# Patient Record
Sex: Male | Born: 1980
Health system: Southern US, Community
[De-identification: ages and names within clinical notes are randomized; demographics above are authoritative.]

## PROBLEM LIST (undated history)

## (undated) DIAGNOSIS — N289 Disorder of kidney and ureter, unspecified: Secondary | ICD-10-CM

## (undated) DIAGNOSIS — M5020 Other cervical disc displacement, unspecified cervical region: Secondary | ICD-10-CM

## (undated) DIAGNOSIS — E119 Type 2 diabetes mellitus without complications: Secondary | ICD-10-CM

## (undated) DIAGNOSIS — N200 Calculus of kidney: Secondary | ICD-10-CM

## (undated) HISTORY — PX: APPENDECTOMY: SHX54

## (undated) HISTORY — PX: LITHOTRIPSY: SUR834

## (undated) HISTORY — PX: KNEE ARTHROSCOPY: SUR90

## (undated) HISTORY — PX: BACK SURGERY: SHX140

## (undated) HISTORY — DX: Type 2 diabetes mellitus without complications: E11.9

---

## 2001-01-26 ENCOUNTER — Encounter: Payer: Self-pay | Admitting: Emergency Medicine

## 2001-01-26 ENCOUNTER — Emergency Department (HOSPITAL_COMMUNITY): Admission: EM | Admit: 2001-01-26 | Discharge: 2001-01-26 | Payer: Self-pay | Admitting: Emergency Medicine

## 2002-04-19 ENCOUNTER — Encounter: Payer: Self-pay | Admitting: Emergency Medicine

## 2002-04-19 ENCOUNTER — Emergency Department (HOSPITAL_COMMUNITY): Admission: EM | Admit: 2002-04-19 | Discharge: 2002-04-19 | Payer: Self-pay | Admitting: Emergency Medicine

## 2003-05-09 ENCOUNTER — Ambulatory Visit (HOSPITAL_COMMUNITY): Admission: RE | Admit: 2003-05-09 | Discharge: 2003-05-09 | Payer: Self-pay | Admitting: Internal Medicine

## 2003-10-21 ENCOUNTER — Emergency Department (HOSPITAL_COMMUNITY): Admission: EM | Admit: 2003-10-21 | Discharge: 2003-10-21 | Payer: Self-pay | Admitting: Emergency Medicine

## 2003-11-03 ENCOUNTER — Emergency Department (HOSPITAL_COMMUNITY): Admission: EM | Admit: 2003-11-03 | Discharge: 2003-11-03 | Payer: Self-pay | Admitting: Family Medicine

## 2010-10-26 ENCOUNTER — Other Ambulatory Visit (HOSPITAL_COMMUNITY): Payer: Self-pay | Admitting: Internal Medicine

## 2010-10-26 ENCOUNTER — Ambulatory Visit (HOSPITAL_COMMUNITY)
Admission: RE | Admit: 2010-10-26 | Discharge: 2010-10-26 | Disposition: A | Discharge status: Home / Self Care | Payer: BC Managed Care – PPO | Source: Ambulatory Visit | Attending: Internal Medicine | Admitting: Internal Medicine

## 2010-10-26 DIAGNOSIS — R0789 Other chest pain: Secondary | ICD-10-CM | POA: Insufficient documentation

## 2010-10-26 DIAGNOSIS — M791 Myalgia, unspecified site: Secondary | ICD-10-CM

## 2011-01-03 ENCOUNTER — Emergency Department (HOSPITAL_COMMUNITY)
Admission: EM | Admit: 2011-01-03 | Discharge: 2011-01-03 | Disposition: A | Discharge status: Home / Self Care | Payer: BC Managed Care – PPO | Attending: Emergency Medicine | Admitting: Emergency Medicine

## 2011-01-03 ENCOUNTER — Encounter: Payer: Self-pay | Admitting: *Deleted

## 2011-01-03 DIAGNOSIS — K0889 Other specified disorders of teeth and supporting structures: Secondary | ICD-10-CM

## 2011-01-03 DIAGNOSIS — H9209 Otalgia, unspecified ear: Secondary | ICD-10-CM | POA: Insufficient documentation

## 2011-01-03 DIAGNOSIS — R51 Headache: Secondary | ICD-10-CM | POA: Insufficient documentation

## 2011-01-03 DIAGNOSIS — K029 Dental caries, unspecified: Secondary | ICD-10-CM | POA: Insufficient documentation

## 2011-01-03 MED ORDER — PENICILLIN V POTASSIUM 500 MG PO TABS: 500.0000 mg | ORAL_TABLET | Freq: Four times a day (QID) | ORAL | Status: AC

## 2011-01-03 MED ORDER — OXYCODONE-ACETAMINOPHEN 5-325 MG PO TABS: 1.0000 | ORAL_TABLET | ORAL | Status: AC | PRN

## 2011-01-03 MED ORDER — PENICILLIN V POTASSIUM 250 MG PO TABS
500.0000 mg | ORAL_TABLET | Freq: Once | ORAL | Status: AC
  Administered 2011-01-03: 500 mg via ORAL
  Filled 2011-01-03: qty 2

## 2011-01-03 MED ORDER — OXYCODONE-ACETAMINOPHEN 5-325 MG PO TABS
2.0000 | ORAL_TABLET | Freq: Once | ORAL | Status: AC
  Administered 2011-01-03: 2 via ORAL
  Filled 2011-01-03: qty 2

## 2011-01-03 NOTE — ED Notes (Signed)
C/o wisdom teeth pain (upper and lower) x 2 days; states took goody powder today and hydrocodone yesterday with no relief

## 2011-01-03 NOTE — ED Notes (Signed)
Iva Lento, PA-C at bedside to examine

## 2011-01-03 NOTE — ED Provider Notes (Signed)
History     Chief Complaint  Patient presents with  . Dental Pain    c/o right sided molar pain (upper and lower) x 2 days   HPI Comments: Patient c/o dental pain for two days.  States he has appt with a dentist on Monday.  Has hx of previous dental infections.    Also c/o right sided headache that gradually began today.  He denies fever, difficulty swallowing ,breathing, neck pain or siffness or vomiting.  States the teeth are so painful that he has not been able to sleep.    Patient is a 30 y.o. male presenting with tooth pain. The history is provided by the patient.  Dental PainThe primary symptoms include mouth pain and headaches. Primary symptoms do not include oral bleeding, fever, shortness of breath, sore throat, angioedema or cough. The symptoms began 2 days ago. The symptoms are worsening. The symptoms are chronic. The symptoms occur constantly.  Mouth pain began 24 -48 hours ago. Mouth pain occurs constantly. Mouth pain is worsening. Affected locations include: teeth. At its highest the mouth pain was at 10/10. The mouth pain is currently at 10/10.  The headache is not associated with eye pain, neck stiffness or weakness.  Additional symptoms include: dental sensitivity to temperature, gum tenderness and ear pain. Additional symptoms do not include: gum swelling, purulent gums, trismus, facial swelling, trouble swallowing, drooling and swollen glands.    History reviewed. No pertinent past medical history.  Past Surgical History  Procedure Date  . Appendectomy   . Knee arthroscopy     No family history on file.  History  Substance Use Topics  . Smoking status: Current Everyday Smoker -- 1.0 packs/day    Types: Cigarettes  . Smokeless tobacco: Not on file  . Alcohol Use: No      Review of Systems  Constitutional: Negative for fever, activity change and appetite change.  HENT: Positive for ear pain and dental problem. Negative for sore throat, facial swelling,  drooling, trouble swallowing, neck pain and neck stiffness.   Eyes: Negative for pain.  Respiratory: Negative for cough and shortness of breath.   Gastrointestinal: Negative for nausea, vomiting and abdominal pain.  Musculoskeletal: Negative.   Skin: Negative.   Neurological: Positive for headaches. Negative for dizziness, facial asymmetry, weakness and numbness.  Hematological: Does not bruise/bleed easily.    Physical Exam  BP 143/87  Pulse 71  Temp(Src) 98.9 F (37.2 C) (Oral)  Resp 20  Ht 6\' 3"  (1.905 m)  Wt 245 lb (111.131 kg)  BMI 30.62 kg/m2  SpO2 100%  Physical Exam  Nursing note and vitals reviewed. Constitutional: He is oriented to person, place, and time. Vital signs are normal. He appears well-developed and well-nourished.  Non-toxic appearance.  HENT:  Head: Normocephalic and atraumatic. No trismus in the jaw.  Right Ear: External ear normal.  Left Ear: External ear normal.  Mouth/Throat: Uvula is midline, oropharynx is clear and moist and mucous membranes are normal. Dental caries present. No dental abscesses or uvula swelling.    Eyes: EOM are normal. Pupils are equal, round, and reactive to light.  Neck: Normal range of motion. Neck supple. No thyromegaly present.  Cardiovascular: Normal rate, regular rhythm and normal heart sounds.   Pulmonary/Chest: Effort normal and breath sounds normal.  Abdominal: There is no tenderness.  Musculoskeletal: Normal range of motion.  Lymphadenopathy:    He has no cervical adenopathy.  Neurological: He is alert and oriented to person, place, and time. He  exhibits normal muscle tone. Coordination normal.  Skin: Skin is warm and dry.  Psychiatric: He has a normal mood and affect.    ED Course  Procedures  MDM     Multiple dental caries.  Decay of the right lower third molar and right upper third molar.  Vitals stable.  Non-toxic appearing,  Airway intact.      Launa Goedken L. [place], [place] 01/03/11 2131

## 2011-01-04 NOTE — ED Provider Notes (Signed)
Medical screening examination/treatment/procedure(s) were performed by non-physician practitioner and as supervising physician I was immediately available for consultation/collaboration.   Laray Anger, DO 01/04/11 1658

## 2011-05-11 ENCOUNTER — Emergency Department (HOSPITAL_COMMUNITY)
Admission: EM | Admit: 2011-05-11 | Discharge: 2011-05-11 | Disposition: A | Discharge status: Home / Self Care | Payer: BC Managed Care – PPO | Attending: Emergency Medicine | Admitting: Emergency Medicine

## 2011-05-11 ENCOUNTER — Encounter (HOSPITAL_COMMUNITY): Payer: Self-pay

## 2011-05-11 ENCOUNTER — Emergency Department (HOSPITAL_COMMUNITY): Payer: BC Managed Care – PPO

## 2011-05-11 DIAGNOSIS — H6690 Otitis media, unspecified, unspecified ear: Secondary | ICD-10-CM | POA: Insufficient documentation

## 2011-05-11 DIAGNOSIS — R059 Cough, unspecified: Secondary | ICD-10-CM | POA: Insufficient documentation

## 2011-05-11 DIAGNOSIS — F17200 Nicotine dependence, unspecified, uncomplicated: Secondary | ICD-10-CM | POA: Insufficient documentation

## 2011-05-11 DIAGNOSIS — R05 Cough: Secondary | ICD-10-CM | POA: Insufficient documentation

## 2011-05-11 MED ORDER — AMOXICILLIN 500 MG PO CAPS: 500.0000 mg | ORAL_CAPSULE | Freq: Three times a day (TID) | ORAL | Status: AC

## 2011-05-11 MED ORDER — IBUPROFEN 800 MG PO TABS
ORAL_TABLET | ORAL | Status: AC
  Filled 2011-05-11: qty 1

## 2011-05-11 MED ORDER — GUAIFENESIN-CODEINE 100-10 MG/5ML PO SYRP: 10.0000 mL | ORAL_SOLUTION | Freq: Three times a day (TID) | ORAL | Status: AC | PRN

## 2011-05-11 MED ORDER — IBUPROFEN 800 MG PO TABS
800.0000 mg | ORAL_TABLET | Freq: Once | ORAL | Status: AC
  Administered 2011-05-11: 800 mg via ORAL

## 2011-05-11 NOTE — ED Notes (Signed)
Pt presents with flu like symptoms. Pt reports cough, fever, body aches, nasal congestion, and vomiting. Symptoms started on Thursday. Pts was recently exposed to strep and flu. Pts daughters were recently diagnosed with flu and strep.

## 2011-05-11 NOTE — ED Notes (Signed)
Tammy PA notified of vital signs, additional orders given and carried out

## 2011-05-11 NOTE — ED Notes (Signed)
Tammy PA advised to discharge pt

## 2011-05-11 NOTE — ED Provider Notes (Signed)
History     CSN: [id number] Arrival date & time: 05/11/2011 11:00 AM   First MD Initiated Contact with Patient 05/11/11 1126      Chief Complaint  Patient presents with  . Cough  . Nasal Congestion  . Fever  . Emesis  . Generalized Body Aches    (Consider location/radiation/quality/duration/timing/severity/associated sxs/prior treatment) Patient is a 30 y.o. male presenting with URI. The history is provided by the patient.  URI The primary symptoms include fever, headaches, ear pain, sore throat, swollen glands, cough and myalgias. Primary symptoms do not include fatigue, wheezing, abdominal pain, nausea, vomiting, arthralgias or rash. The current episode started 2 days ago. This is a new problem. The problem has not changed since onset. The fever began yesterday. The fever has been unchanged since its onset. The maximum temperature recorded prior to his arrival was 102 to 102.9 F. The temperature was taken by an oral thermometer.  The headache is not associated with eye pain, neck stiffness or weakness.  The ear pain began yesterday. Ear pain is a new problem. The ear pain has been unchanged since its onset. The left ear is affected. The pain is mild.    The sore throat began 2 days ago. The sore throat has been unchanged since its onset. The sore throat is mild in intensity. The sore throat is not accompanied by trouble swallowing, drooling, hoarse voice or stridor.  The swelling is not associated with trouble swallowing or neck pain.  The cough began 2 days ago. The cough is new. The cough is non-productive. There is nondescript sputum produced.  The myalgias are not associated with weakness.  The onset of the illness is associated with exposure to sick contacts. Symptoms associated with the illness include chills, plugged ear sensation, congestion and rhinorrhea. The illness is not associated with facial pain or sinus pressure.    History reviewed. No pertinent past medical  history.  Past Surgical History  Procedure Date  . Appendectomy   . Knee arthroscopy     History reviewed. No pertinent family history.  History  Substance Use Topics  . Smoking status: Current Everyday Smoker -- 1.0 packs/day    Types: Cigarettes  . Smokeless tobacco: Not on file  . Alcohol Use: No      Review of Systems  Constitutional: Positive for fever and chills. Negative for appetite change and fatigue.  HENT: Positive for ear pain, congestion, sore throat and rhinorrhea. Negative for hoarse voice, facial swelling, drooling, trouble swallowing, neck pain, neck stiffness and sinus pressure.   Eyes: Negative for pain and visual disturbance.  Respiratory: Positive for cough. Negative for chest tightness, shortness of breath, wheezing and stridor.   Cardiovascular: Negative for chest pain and palpitations.  Gastrointestinal: Negative for nausea, vomiting and abdominal pain.  Genitourinary: Negative for dysuria, hematuria and flank pain.  Musculoskeletal: Positive for myalgias. Negative for back pain and arthralgias.  Skin: Negative.  Negative for rash.  Neurological: Positive for headaches. Negative for dizziness, weakness and numbness.  Hematological: Does not bruise/bleed easily.    Allergies  Review of patient's allergies indicates no known allergies.  Home Medications   Current Outpatient Rx  Name Route Sig Dispense Refill  . GOODY HEADACHE PO Oral Take 1 Container by mouth every 4 (four) hours as needed. For tooth pain    . NYQUIL PO Oral Take 1 capsule by mouth daily.      . DAYQUIL PO Oral Take 1 capsule by mouth daily.  BP 148/85  Pulse 99  Temp(Src) 98.9 F (37.2 C) (Oral)  Resp 20  Ht 6\' 4"  (1.93 m)  Wt 235 lb (106.595 kg)  BMI 28.61 kg/m2  SpO2 98%  Physical Exam  Nursing note and vitals reviewed. Constitutional: He is oriented to person, place, and time. He appears well-developed and well-nourished. No distress.  HENT:  Head:  Normocephalic and atraumatic. No trismus in the jaw.  Right Ear: Tympanic membrane and ear canal normal.  Left Ear: Ear canal normal. No mastoid tenderness. Tympanic membrane is erythematous and bulging. No hemotympanum.  Nose: Mucosal edema and rhinorrhea present.  Mouth/Throat: Uvula is midline, oropharynx is clear and moist and mucous membranes are normal. No uvula swelling.  Eyes: EOM are normal. Pupils are equal, round, and reactive to light.  Neck: Normal range of motion. Neck supple.  Cardiovascular: Normal rate, regular rhythm and normal heart sounds.   Pulmonary/Chest: Effort normal and breath sounds normal. No respiratory distress. He has no wheezes. He has no rales. He exhibits no tenderness.  Abdominal: Soft. He exhibits no distension. There is no tenderness.  Musculoskeletal: Normal range of motion. He exhibits no edema and no tenderness.  Lymphadenopathy:    He has no cervical adenopathy.  Neurological: He is alert and oriented to person, place, and time. No cranial nerve deficit. He exhibits normal muscle tone. Coordination normal.  Skin: Skin is warm and dry.    ED Course  Procedures (including critical care time)   Dg Chest 2 View  05/11/2011  *RADIOLOGY REPORT*  Clinical Data: Cough, fever  CHEST - 2 VIEW  Comparison:  10/26/2010  Findings:  The heart size and mediastinal contours are within normal limits.  Both lungs are clear.  The visualized skeletal structures are unremarkable.  IMPRESSION: No active cardiopulmonary disease.  Original Report Authenticated By: Judie Petit. Ruel Favors, M.D.         MDM   2:06 PM   Patient is ambulatory.  Non-toxic appearing.  Vitals stable. Lung sounds are coarse throughout.  Has left otitis media that I will treat with amoxil .  F/u if needed  Patient / Family / Caregiver understand and agree with initial ED impression and plan with expectations set for ED visit.         Hanadi Stanly L. [place], [place] 05/12/11 1947

## 2011-05-11 NOTE — ED Notes (Signed)
C/o generalized body aches, fever, cough, chills, pt recently exposed to daughter who has been dx with strep and flu, started a few days ago,

## 2011-05-13 NOTE — ED Provider Notes (Signed)
Medical screening examination/treatment/procedure(s) were performed by non-physician practitioner and as supervising physician I was immediately available for consultation/collaboration. Devoria Albe, MD, Arm[facility] Gang   Ward Givens, MD 05/13/11 [id number]

## 2012-04-02 ENCOUNTER — Emergency Department (HOSPITAL_COMMUNITY)
Admission: EM | Admit: 2012-04-02 | Discharge: 2012-04-02 | Disposition: A | Discharge status: Home / Self Care | Payer: BC Managed Care – PPO | Attending: Emergency Medicine | Admitting: Emergency Medicine

## 2012-04-02 ENCOUNTER — Encounter (HOSPITAL_COMMUNITY): Payer: Self-pay | Admitting: *Deleted

## 2012-04-02 ENCOUNTER — Emergency Department (HOSPITAL_COMMUNITY): Payer: BC Managed Care – PPO

## 2012-04-02 DIAGNOSIS — J3489 Other specified disorders of nose and nasal sinuses: Secondary | ICD-10-CM

## 2012-04-02 DIAGNOSIS — R197 Diarrhea, unspecified: Secondary | ICD-10-CM | POA: Insufficient documentation

## 2012-04-02 DIAGNOSIS — F17200 Nicotine dependence, unspecified, uncomplicated: Secondary | ICD-10-CM | POA: Insufficient documentation

## 2012-04-02 DIAGNOSIS — Z9889 Other specified postprocedural states: Secondary | ICD-10-CM | POA: Insufficient documentation

## 2012-04-02 DIAGNOSIS — J189 Pneumonia, unspecified organism: Secondary | ICD-10-CM | POA: Insufficient documentation

## 2012-04-02 DIAGNOSIS — E860 Dehydration: Secondary | ICD-10-CM

## 2012-04-02 DIAGNOSIS — R112 Nausea with vomiting, unspecified: Secondary | ICD-10-CM | POA: Insufficient documentation

## 2012-04-02 MED ORDER — SODIUM CHLORIDE 0.9 % IV SOLN
1000.0000 mL | Freq: Once | INTRAVENOUS | Status: AC
  Administered 2012-04-02: 1000 mL via INTRAVENOUS

## 2012-04-02 MED ORDER — SODIUM CHLORIDE 0.9 % IV SOLN
1000.0000 mL | INTRAVENOUS | Status: DC
  Administered 2012-04-02: 1000 mL via INTRAVENOUS

## 2012-04-02 MED ORDER — DEXTROSE 5 % IV SOLN
1.0000 g | Freq: Once | INTRAVENOUS | Status: AC
  Administered 2012-04-02: 1 g via INTRAVENOUS
  Filled 2012-04-02: qty 10

## 2012-04-02 MED ORDER — ONDANSETRON 8 MG PO TBDP
8.0000 mg | ORAL_TABLET | Freq: Once | ORAL | Status: AC
  Administered 2012-04-02: 8 mg via ORAL
  Filled 2012-04-02: qty 1

## 2012-04-02 MED ORDER — ONDANSETRON 8 MG PO TBDP: 8.0000 mg | ORAL_TABLET | Freq: Three times a day (TID) | ORAL | Status: DC | PRN

## 2012-04-02 MED ORDER — AZITHROMYCIN 250 MG PO TABS
500.0000 mg | ORAL_TABLET | Freq: Once | ORAL | Status: AC
  Administered 2012-04-02: 500 mg via ORAL
  Filled 2012-04-02: qty 2

## 2012-04-02 MED ORDER — AZITHROMYCIN 250 MG PO TABS: ORAL_TABLET | ORAL | Status: DC

## 2012-04-02 MED ORDER — KETOROLAC TROMETHAMINE 30 MG/ML IJ SOLN
30.0000 mg | Freq: Once | INTRAMUSCULAR | Status: AC
  Administered 2012-04-02: 30 mg via INTRAVENOUS
  Filled 2012-04-02: qty 1

## 2012-04-02 MED ORDER — MUCINEX DM 30-600 MG PO TB12: ORAL_TABLET | ORAL | Status: DC

## 2012-04-02 NOTE — ED Notes (Signed)
Rainbow and 1 set of blood cultures drawn prior to administration of IV antibiotic. EDP made aware, no blood work orders given at this time.

## 2012-04-02 NOTE — ED Notes (Signed)
Pt alert & oriented x4, stable gait. Patient given discharge instructions, paperwork & prescription(s). Patient  instructed to stop at the registration desk to finish any additional paperwork. Patient verbalized understanding. Pt left department w/ no further questions. 

## 2012-04-02 NOTE — ED Notes (Signed)
Patient c/o generalized body aches. Rates pain at a 7-8 out of 10. EDP made aware, verbal order given.

## 2012-04-02 NOTE — ED Provider Notes (Cosign Needed)
History   This chart was scribed for Ward Givens, MD by Gerlean Ren. This patient was seen in room [facility]03/[facility]03 and the patient's care was started at 15:45.   CSN: [id number]  Arrival date & time 04/02/12  1503   First MD Initiated Contact with Patient 04/02/12 1524      Chief Complaint  Patient presents with  . Influenza    (Consider location/radiation/quality/duration/timing/severity/associated sxs/prior treatment) The history is provided by the patient. No language interpreter was used.  Shane Mayo is a 31 y.o. male who presents to the Emergency Department complaining of constant flu-like myalgias with associated white phlegm-producing cough, "plugged up ears, yellow rhinorrhea, and HA that began last week and resolved over the weekend with Nyquil and Dayquil but returned 3 days ago and are gradually worsening.  Pt reports 5 episodes of non-bloody diarrhea and non-bloody, non-bilious emesis 2 days ago that has since resolved but with persistent nausea, and 101.2 fever 2 days ago that has since resolved but reports waxing-and-waning chills since.  Pt denies wheezes, dyspnea, sore throat, and CP. States he feels weak.  He states he doesn't have chest pain but his chest is sore when he coughs.  Pt reports known sick contacts at work.  Pt is a current everyday smoker but denies alcohol use.     Dr. Ouida Sills is PCP.    History reviewed. No pertinent past medical history.  Past Surgical History  Procedure Date  . Appendectomy   . Knee arthroscopy     No family history on file.  History  Substance Use Topics  . Smoking status: Current Every Day Smoker -- 1.0 packs/day    Types: Cigarettes  . Smokeless tobacco: Not on file  . Alcohol Use: No   employed   Review of Systems  Constitutional: Positive for chills. Negative for fever.  HENT: Positive for rhinorrhea. Negative for ear pain and sore throat.   Respiratory: Positive for cough. Negative for chest tightness, shortness of  breath and wheezing.   Cardiovascular: Negative for chest pain.  Gastrointestinal: Positive for nausea. Negative for vomiting and diarrhea.  Musculoskeletal: Positive for myalgias.  Neurological: Positive for weakness. Negative for dizziness.  All other systems reviewed and are negative.    Allergies  Review of patient's allergies indicates no known allergies.  Home Medications   Current Outpatient Rx  Name Route Sig Dispense Refill  . GOODY HEADACHE PO Oral Take 1 Container by mouth every 4 (four) hours as needed. For tooth pain    . NYQUIL PO Oral Take 1 capsule by mouth daily.      . DAYQUIL PO Oral Take 1 capsule by mouth daily.        BP 153/89  Pulse 120  Temp 99.1 F (37.3 C)  Resp 20  Ht 6\' 4"  (1.93 m)  Wt 240 lb (108.863 kg)  BMI 29.21 kg/m2  SpO2 100%  Vital signs normal except tachycardia   Physical Exam  Nursing note and vitals reviewed. Constitutional: He is oriented to person, place, and time. He appears well-developed and well-nourished.  Non-toxic appearance. He does not appear ill. No distress.  HENT:  Head: Normocephalic and atraumatic.  Right Ear: External ear normal.  Left Ear: External ear normal.  Nose: Nose normal. No mucosal edema or rhinorrhea.  Mouth/Throat: Oropharynx is clear and moist and mucous membranes are normal. No dental abscesses or uvula swelling. No posterior oropharyngeal erythema.  Eyes: Conjunctivae normal and EOM are normal. Pupils are equal, round,  and reactive to light.  Neck: Normal range of motion and full passive range of motion without pain. Neck supple.  Cardiovascular: Regular rhythm and normal heart sounds.  Exam reveals no gallop and no friction rub.   No murmur heard.      Tachycardia.  Pulmonary/Chest: Effort normal and breath sounds normal. No respiratory distress. He has no wheezes. He has no rhonchi. He has no rales. He exhibits no tenderness and no crepitus.  Abdominal: Soft. Normal appearance and bowel sounds  are normal. He exhibits no distension. There is no tenderness. There is no rebound and no guarding.  Musculoskeletal: Normal range of motion. He exhibits no edema and no tenderness.       Moves all extremities well.   Neurological: He is alert and oriented to person, place, and time. He has normal strength. No cranial nerve deficit.  Skin: Skin is warm, dry and intact. No rash noted. No erythema. No pallor.  Psychiatric: He has a normal mood and affect. His speech is normal and behavior is normal. His mood appears not anxious.    ED Course  Procedures (including critical care time)   Medications  0.9 %  sodium chloride infusion (0 mL Intravenous Stopped 04/02/12 1842)    Followed by  0.9 %  sodium chloride infusion (1000 mL Intravenous New Bag/Given 04/02/12 1842)    Followed by  0.9 %  sodium chloride infusion (not administered)  azithromycin (ZITHROMAX) tablet 500 mg (not administered)  ondansetron (ZOFRAN-ODT) disintegrating tablet 8 mg (8 mg Oral Given 04/02/12 1616)  cefTRIAXone (ROCEPHIN) 1 g in dextrose 5 % 50 mL IVPB (0 g Intravenous Stopped 04/02/12 1805)  ketorolac (TORADOL) 30 MG/ML injection 30 mg (30 mg Intravenous Given 04/02/12 1854)    DIAGNOSTIC STUDIES: Oxygen Saturation is 100% on room air, normal by my interpretation.    COORDINATION OF CARE: 15:52- Patient informed of clinical course, understands medical decision-making process, and agrees with plan.  Ordered PO zofran (discussed IV zofran if emesis occurs) and chest XR. 16:47- Re-check, informed pt of positive XR for pneumonia.   18:53- Re-check, discussed discharge with prescribed antibiotics.  Labs Reviewed - No data to display Dg Chest 2 View  04/02/2012  *RADIOLOGY REPORT*  Clinical Data: 31 year old male cough weakness fever shortness of breath.  CHEST - 2 VIEW  Comparison: 05/11/2011.  Findings: Consolidation in the peripheral right upper lobe along the major fissure.  Stable cardiac size and mediastinal  contours. Stable lung volumes.  Lung parenchyma elsewhere remains clear.  No effusion. Visualized tracheal air column is within normal limits. No acute osseous abnormality identified.  IMPRESSION: Right upper lobe pneumonia. Post-treatment radiographs recommended to document resolution.   Original Report Authenticated By: Harley Hallmark, M.D.      1. Community acquired pneumonia   2. Dehydration   3. Vomiting and diarrhea   4. Rhinorrhea     New Prescriptions   AZITHROMYCIN (ZITHROMAX Z-PAK) 250 MG TABLET    Take 1 pill once a day for the next 4 days starting Friday afternoon   DEXTROMETHORPHAN-GUAIFENESIN (MUCINEX DM) 30-600 MG TB12    Take 1 or 2 po Q 12hrs for cough, congestion   ONDANSETRON (ZOFRAN ODT) 8 MG DISINTEGRATING TABLET    Take 1 tablet (8 mg total) by mouth every 8 (eight) hours as needed for nausea.    Plan discharge  Devoria Albe, MD, FACEP   MDM   I personally performed the services described in this documentation, which was scribed in  my presence. The recorded information has been reviewed and considered.  Devoria Albe, MD, FACEP      Ward Givens, MD 04/02/12 Ernestina [place]

## 2012-04-02 NOTE — ED Notes (Signed)
C/o fever, chills, body aches, headache, bilateral ear pain, runny nose,  Cough that is productive with yellow phlegm. N/v/d, Pt states that the started getting sick two weeks ago,

## 2012-11-16 ENCOUNTER — Other Ambulatory Visit (HOSPITAL_COMMUNITY): Payer: Self-pay | Admitting: Internal Medicine

## 2012-11-16 DIAGNOSIS — M545 Low back pain, unspecified: Secondary | ICD-10-CM

## 2012-11-18 ENCOUNTER — Ambulatory Visit (HOSPITAL_COMMUNITY)
Admission: RE | Admit: 2012-11-18 | Discharge: 2012-11-18 | Disposition: A | Discharge status: Home / Self Care | Payer: BC Managed Care – PPO | Source: Ambulatory Visit | Attending: Internal Medicine | Admitting: Internal Medicine

## 2012-11-18 DIAGNOSIS — M545 Low back pain, unspecified: Secondary | ICD-10-CM | POA: Insufficient documentation

## 2012-11-18 DIAGNOSIS — M5126 Other intervertebral disc displacement, lumbar region: Secondary | ICD-10-CM | POA: Insufficient documentation

## 2013-01-12 ENCOUNTER — Emergency Department (HOSPITAL_COMMUNITY)
Admission: EM | Admit: 2013-01-12 | Discharge: 2013-01-12 | Disposition: A | Discharge status: Home / Self Care | Payer: BC Managed Care – PPO | Attending: Emergency Medicine | Admitting: Emergency Medicine

## 2013-01-12 ENCOUNTER — Encounter (HOSPITAL_COMMUNITY): Payer: Self-pay

## 2013-01-12 ENCOUNTER — Emergency Department (HOSPITAL_COMMUNITY): Payer: BC Managed Care – PPO

## 2013-01-12 DIAGNOSIS — Z8739 Personal history of other diseases of the musculoskeletal system and connective tissue: Secondary | ICD-10-CM | POA: Insufficient documentation

## 2013-01-12 DIAGNOSIS — Z9889 Other specified postprocedural states: Secondary | ICD-10-CM | POA: Insufficient documentation

## 2013-01-12 DIAGNOSIS — R109 Unspecified abdominal pain: Secondary | ICD-10-CM

## 2013-01-12 DIAGNOSIS — R1032 Left lower quadrant pain: Secondary | ICD-10-CM | POA: Insufficient documentation

## 2013-01-12 DIAGNOSIS — F17200 Nicotine dependence, unspecified, uncomplicated: Secondary | ICD-10-CM | POA: Insufficient documentation

## 2013-01-12 DIAGNOSIS — G8929 Other chronic pain: Secondary | ICD-10-CM | POA: Insufficient documentation

## 2013-01-12 DIAGNOSIS — R110 Nausea: Secondary | ICD-10-CM | POA: Insufficient documentation

## 2013-01-12 DIAGNOSIS — M549 Dorsalgia, unspecified: Secondary | ICD-10-CM | POA: Insufficient documentation

## 2013-01-12 HISTORY — DX: Other cervical disc displacement, unspecified cervical region: M50.20

## 2013-01-12 LAB — URINALYSIS, ROUTINE W REFLEX MICROSCOPIC
Hgb urine dipstick: NEGATIVE
Ketones, ur: NEGATIVE mg/dL
Protein, ur: NEGATIVE mg/dL
Urobilinogen, UA: 0.2 mg/dL (ref 0.0–1.0)

## 2013-01-12 MED ORDER — DICYCLOMINE HCL 20 MG PO TABS: 20.0000 mg | ORAL_TABLET | Freq: Four times a day (QID) | ORAL | Status: DC | PRN

## 2013-01-12 MED ORDER — TRAMADOL HCL 50 MG PO TABS: 50.0000 mg | ORAL_TABLET | Freq: Four times a day (QID) | ORAL | Status: DC | PRN

## 2013-01-12 NOTE — ED Notes (Signed)
States that he saw his PCP about 11 days ago and was placed on antibiotics for suspected epididymitis.  States that he continued to have pain and called his PCP on Friday and was placed on a stronger antibiotic.  States that he woke up today with excruciating pain in his right groin and abdomen and was referred to the ER for evaluation.

## 2013-01-12 NOTE — ED Notes (Signed)
Pt reports left side ab pain and groin pain for several weeks, has been seen by his pmd, and unable to find the cause, denies any blood in urine or painful urination, +nausea.  No discharge. Or fever.

## 2013-01-12 NOTE — ED Provider Notes (Signed)
CSN: [id number]     Arrival date & time 01/12/13  1134 History  This chart was scribed for Gilda Crease, MD by Bennett Scrape, ED Scribe. This patient was seen in room APA04/APA04 and the patient's care was started at 11:46 AM.    Chief Complaint  Patient presents with  . Abdominal Pain    The history is provided by the patient. No language interpreter was used.    HPI Comments: Shane Mayo is a 32 y.o. male who presents to the Emergency Department complaining of LLQ abdominal pain that started this morning. Pt states that he has a h/o chronic back pain and began to ave radiation into his right groin 2 weeks ago. He states that the back pain began improving but the right groin pain remained. He was evaluated by his PCP 11 days ago and started on antibiotics for suspected epididymitis. He reports that the pain has continued, so he called his PCP back 3 days ago and was started on a stronger antibiotic. He states that today he woke today with "severe" LLQ abdominal pain that radiates from the right groin. He reports associated nausea but denies urinary symptoms, emesis and diarrhea.  Past Medical History  Diagnosis Date  . Herniated cervical disc    Past Surgical History  Procedure Laterality Date  . Appendectomy    . Knee arthroscopy     No family history on file. History  Substance Use Topics  . Smoking status: Current Every Day Smoker -- 1.00 packs/day    Types: Cigarettes  . Smokeless tobacco: Not on file  . Alcohol Use: No    Review of Systems  Gastrointestinal: Positive for nausea and abdominal pain. Negative for vomiting and diarrhea.  Genitourinary: Negative for dysuria and hematuria.  Musculoskeletal: Positive for back pain.  All other systems reviewed and are negative.    Allergies  Review of patient's allergies indicates no known allergies.  Home Medications   Current Outpatient Rx  Name  Route  Sig  Dispense  Refill  . azithromycin (ZITHROMAX  Z-PAK) 250 MG tablet      Take 1 pill once a day for the next 4 days starting Friday afternoon   4 tablet   0   . Dextromethorphan-Guaifenesin (MUCINEX DM) 30-600 MG TB12      Take 1 or 2 po Q 12hrs for cough, congestion   28 each   0   . ondansetron (ZOFRAN ODT) 8 MG disintegrating tablet   Oral   Take 1 tablet (8 mg total) by mouth every 8 (eight) hours as needed for nausea.   6 tablet   0   . Pseudoeph-Doxylamine-DM-APAP (NYQUIL PO)   Oral   Take 30 mLs by mouth at bedtime as needed and may repeat dose one time if needed. For cough and flu symptoms         . Pseudoephedrine-APAP-DM (DAYQUIL PO)   Oral   Take 30 mLs by mouth every 6 (six) hours as needed. For cough and flu symptoms          Triage Vitals: BP 141/83  Pulse 88  Temp(Src) 97.8 F (36.6 C)  Resp 20  Ht 6\' 4"  (1.93 m)  Wt 245 lb (111.131 kg)  BMI 29.83 kg/m2  SpO2 100%  Physical Exam  Nursing note and vitals reviewed. Constitutional: He is oriented to person, place, and time. He appears well-developed and well-nourished. No distress.  HENT:  Head: Normocephalic and atraumatic.  Right Ear: Hearing normal.  Left Ear: Hearing normal.  Nose: Nose normal.  Mouth/Throat: Oropharynx is clear and moist and mucous membranes are normal.  Eyes: Conjunctivae and EOM are normal. Pupils are equal, round, and reactive to light.  Neck: Normal range of motion. Neck supple.  Cardiovascular: Regular rhythm, S1 normal and S2 normal.  Exam reveals no gallop and no friction rub.   No murmur heard. Pulmonary/Chest: Effort normal and breath sounds normal. No respiratory distress. He exhibits no tenderness.  Abdominal: Soft. Normal appearance and bowel sounds are normal. There is no hepatosplenomegaly. There is tenderness (LLQ). There is no rebound, no guarding, no tenderness at McBurney's point and negative Murphy's sign. No hernia.  Musculoskeletal: Normal range of motion.  Neurological: He is alert and oriented to  person, place, and time. He has normal strength. No cranial nerve deficit or sensory deficit. Coordination normal. GCS eye subscore is 4. GCS verbal subscore is 5. GCS motor subscore is 6.  Skin: Skin is warm, dry and intact. No rash noted. No cyanosis.  Psychiatric: He has a normal mood and affect. His speech is normal and behavior is normal. Thought content normal.    ED Course   Procedures (including critical care time)  DIAGNOSTIC STUDIES: Oxygen Saturation is 100% on room air, normal by my interpretation.    COORDINATION OF CARE: 11:49 AM-Discussed treatment plan which includes CT of abdomen and UA with pt at bedside and pt agreed to plan.   Labs Reviewed  URINALYSIS, ROUTINE W REFLEX MICROSCOPIC - Abnormal; Notable for the following:    Specific Gravity, Urine >1.030 (*)    All other components within normal limits   Ct Abdomen Pelvis Wo Contrast  01/12/2013   *RADIOLOGY REPORT*  Clinical Data: Left flank pain.  Right groin and testicular pain. Being treated for suspected epididymitis.  Prior cholecystectomy.  CT ABDOMEN AND PELVIS WITHOUT CONTRAST  Technique:  Multidetector CT imaging of the abdomen and pelvis was performed following the standard protocol without intravenous contrast.  Comparison: 11/18/2012 MR lumbar spine.  Findings: No extraluminal bowel inflammatory process, free fluid or free air. Post appendectomy.  No renal or ureteral obstructing stone or findings to suggest hydronephrosis.  Fatty infiltration of the liver. Evaluation of solid abdominal viscera is limited by lack of IV contrast.  Taking this limitation into account no worrisome hepatic, splenic, pancreatic, renal or adrenal lesion.  No calcified gallstones.  No abdominal aortic aneurysm.  Calcification right common iliac artery and right internal iliac artery suggestive of age advanced atherosclerosis.  Degenerative changes lumbar spine most notable L5-S1.  Please see prior lumbar spine MR report.  Decompressed  urinary bladder without gross abnormality.  No adenopathy.  Minimal atelectatic changes/scarring lung bases.  IMPRESSION: No extraluminal bowel inflammatory process, free fluid or free air. Post appendectomy.  No renal or ureteral obstructing stone or findings to suggest hydronephrosis.  Fatty infiltration of the liver.  Calcification right common iliac artery and right internal iliac artery suggestive of age advanced atherosclerosis.  Degenerative changes lumbar spine most notable L5-S1.  Please see prior lumbar spine MR report.   Original Report Authenticated By: Lacy Duverney, M.D.   Diagnosis: Abdominal pain  MDM  Patient presents to the ER for evaluation of abdominal pain. Patient has not had any urinary symptoms. He saw his doctor a couple of weeks ago and was placed on Cipro for possible urinary tract infection. He reports that today he woke up with severe, sharp and stabbing pains in the left side of his abdomen  which were new. He was told to go the ER by his doctor to be evaluated for kidney stone.  Urinalysis was unremarkable. CT scan was performed and does not show any evidence of ureterolithiasis. Additionally there is no sign of diverticulitis or other pathology.  Patient was reassured, given analgesia and followup with primary doctor.  I personally performed the services described in this documentation, which was scribed in my presence. The recorded information has been reviewed and is accurate.   Gilda Crease, MD 01/12/13 [phone]

## 2013-02-09 ENCOUNTER — Ambulatory Visit: Payer: BC Managed Care – PPO | Admitting: Urology

## 2013-03-09 ENCOUNTER — Ambulatory Visit (INDEPENDENT_AMBULATORY_CARE_PROVIDER_SITE_OTHER): Payer: BC Managed Care – PPO | Admitting: Urology

## 2013-03-09 DIAGNOSIS — N509 Disorder of male genital organs, unspecified: Secondary | ICD-10-CM

## 2013-03-09 DIAGNOSIS — R319 Hematuria, unspecified: Secondary | ICD-10-CM

## 2013-04-27 ENCOUNTER — Ambulatory Visit: Payer: BC Managed Care – PPO | Admitting: Urology

## 2013-12-19 ENCOUNTER — Emergency Department (HOSPITAL_COMMUNITY)
Admission: EM | Admit: 2013-12-19 | Discharge: 2013-12-19 | Disposition: A | Discharge status: Home / Self Care | Payer: BC Managed Care – PPO | Attending: Emergency Medicine | Admitting: Emergency Medicine

## 2013-12-19 ENCOUNTER — Emergency Department (HOSPITAL_COMMUNITY): Payer: BC Managed Care – PPO

## 2013-12-19 ENCOUNTER — Encounter (HOSPITAL_COMMUNITY): Payer: Self-pay | Admitting: Emergency Medicine

## 2013-12-19 DIAGNOSIS — Z792 Long term (current) use of antibiotics: Secondary | ICD-10-CM | POA: Insufficient documentation

## 2013-12-19 DIAGNOSIS — Z8739 Personal history of other diseases of the musculoskeletal system and connective tissue: Secondary | ICD-10-CM | POA: Insufficient documentation

## 2013-12-19 DIAGNOSIS — F17200 Nicotine dependence, unspecified, uncomplicated: Secondary | ICD-10-CM | POA: Insufficient documentation

## 2013-12-19 DIAGNOSIS — N200 Calculus of kidney: Secondary | ICD-10-CM

## 2013-12-19 DIAGNOSIS — Z791 Long term (current) use of non-steroidal anti-inflammatories (NSAID): Secondary | ICD-10-CM | POA: Insufficient documentation

## 2013-12-19 LAB — HEPATIC FUNCTION PANEL
ALBUMIN: 4.7 g/dL (ref 3.5–5.2)
ALT: 53 U/L (ref 0–53)
AST: 34 U/L (ref 0–37)
Alkaline Phosphatase: 88 U/L (ref 39–117)
Bilirubin, Direct: <0.2 mg/dL (ref 0.0–0.3)
TOTAL PROTEIN: 8 g/dL (ref 6.0–8.3)
Total Bilirubin: 0.6 mg/dL (ref 0.3–1.2)

## 2013-12-19 LAB — CBC WITH DIFFERENTIAL/PLATELET
Basophils Absolute: 0 10*3/uL (ref 0.0–0.1)
Basophils Relative: 0 % (ref 0–1)
Eosinophils Absolute: 0.2 10*3/uL (ref 0.0–0.7)
Eosinophils Relative: 2 % (ref 0–5)
HEMATOCRIT: 46.4 % (ref 39.0–52.0)
HEMOGLOBIN: 16.8 g/dL (ref 13.0–17.0)
LYMPHS ABS: 1.9 10*3/uL (ref 0.7–4.0)
Lymphocytes Relative: 16 % (ref 12–46)
MCH: 32.4 pg (ref 26.0–34.0)
MCHC: 36.2 g/dL — ABNORMAL HIGH (ref 30.0–36.0)
MCV: 89.6 fL (ref 78.0–100.0)
MONOS PCT: 4 % (ref 3–12)
Monocytes Absolute: 0.5 10*3/uL (ref 0.1–1.0)
NEUTROS ABS: 9.4 10*3/uL — AB (ref 1.7–7.7)
NEUTROS PCT: 78 % — AB (ref 43–77)
Platelets: 234 10*3/uL (ref 150–400)
RBC: 5.18 MIL/uL (ref 4.22–5.81)
RDW: 12.5 % (ref 11.5–15.5)
WBC: 12.1 10*3/uL — ABNORMAL HIGH (ref 4.0–10.5)

## 2013-12-19 LAB — BASIC METABOLIC PANEL
Anion gap: 17 — ABNORMAL HIGH (ref 5–15)
BUN: 15 mg/dL (ref 6–23)
CHLORIDE: 102 meq/L (ref 96–112)
CO2: 22 meq/L (ref 19–32)
Calcium: 9.8 mg/dL (ref 8.4–10.5)
Creatinine, Ser: 0.94 mg/dL (ref 0.50–1.35)
GFR calc Af Amer: >90 mL/min (ref >90)
GFR calc non Af Amer: >90 mL/min (ref >90)
GLUCOSE: 162 mg/dL — AB (ref 70–99)
POTASSIUM: 4.2 meq/L (ref 3.7–5.3)
Sodium: 141 mEq/L (ref 137–147)

## 2013-12-19 LAB — URINALYSIS, ROUTINE W REFLEX MICROSCOPIC
GLUCOSE, UA: NEGATIVE mg/dL
Leukocytes, UA: NEGATIVE
Nitrite: NEGATIVE
PH: 5.5 (ref 5.0–8.0)
Protein, ur: 30 mg/dL — AB
UROBILINOGEN UA: 0.2 mg/dL (ref 0.0–1.0)

## 2013-12-19 LAB — LIPASE, BLOOD: Lipase: 24 U/L (ref 11–59)

## 2013-12-19 LAB — URINE MICROSCOPIC-ADD ON

## 2013-12-19 MED ORDER — OXYCODONE-ACETAMINOPHEN 5-325 MG PO TABS
1.0000 | ORAL_TABLET | Freq: Four times a day (QID) | ORAL | Status: DC | PRN
Start: 2013-12-19 — End: 2014-01-27

## 2013-12-19 MED ORDER — KETOROLAC TROMETHAMINE 30 MG/ML IJ SOLN: 30.0000 mg | Freq: Once | INTRAMUSCULAR | Status: DC

## 2013-12-19 MED ORDER — OXYCODONE-ACETAMINOPHEN 5-325 MG PO TABS: 1.0000 | ORAL_TABLET | Freq: Four times a day (QID) | ORAL | Status: DC | PRN

## 2013-12-19 MED ORDER — FENTANYL CITRATE 0.05 MG/ML IJ SOLN
50.0000 ug | Freq: Once | INTRAMUSCULAR | Status: AC
  Administered 2013-12-19: 50 ug via INTRAVENOUS
  Filled 2013-12-19: qty 2

## 2013-12-19 MED ORDER — ONDANSETRON HCL 4 MG/2ML IJ SOLN
4.0000 mg | Freq: Once | INTRAMUSCULAR | Status: AC
  Administered 2013-12-19: 4 mg via INTRAVENOUS

## 2013-12-19 MED ORDER — SODIUM CHLORIDE 0.9 % IV BOLUS (SEPSIS)
1000.0000 mL | Freq: Once | INTRAVENOUS | Status: AC
  Administered 2013-12-19: 1000 mL via INTRAVENOUS

## 2013-12-19 MED ORDER — HYDROMORPHONE HCL PF 1 MG/ML IJ SOLN
INTRAMUSCULAR | Status: AC
  Filled 2013-12-19: qty 1

## 2013-12-19 MED ORDER — SODIUM CHLORIDE 0.9 % IV SOLN
Freq: Once | INTRAVENOUS | Status: AC
  Administered 2013-12-19: 17:00:00 via INTRAVENOUS

## 2013-12-19 MED ORDER — FENTANYL CITRATE 0.05 MG/ML IJ SOLN
100.0000 ug | Freq: Once | INTRAMUSCULAR | Status: AC
  Administered 2013-12-19: 100 ug via INTRAVENOUS

## 2013-12-19 MED ORDER — HYDROMORPHONE HCL PF 1 MG/ML IJ SOLN
1.0000 mg | Freq: Once | INTRAMUSCULAR | Status: AC
Start: 2013-12-19 — End: 2013-12-19
  Administered 2013-12-19: 1 mg via INTRAVENOUS

## 2013-12-19 MED ORDER — TAMSULOSIN HCL 0.4 MG PO CAPS: 0.4000 mg | ORAL_CAPSULE | Freq: Every day | ORAL | Status: DC

## 2013-12-19 MED ORDER — KETOROLAC TROMETHAMINE 30 MG/ML IJ SOLN
30.0000 mg | Freq: Once | INTRAMUSCULAR | Status: AC
  Administered 2013-12-19: 30 mg via INTRAVENOUS
  Filled 2013-12-19: qty 1

## 2013-12-19 MED ORDER — PROMETHAZINE HCL 25 MG PO TABS: 25.0000 mg | ORAL_TABLET | Freq: Four times a day (QID) | ORAL | Status: DC | PRN

## 2013-12-19 NOTE — ED Notes (Signed)
Pt brought here via EMS with severe RLQ abdominal pain since that started at 0230 but has gotten much worse with pain that descends into rt testicle. Pt has severe vomiting that accompanies this.

## 2013-12-19 NOTE — ED Provider Notes (Signed)
CSN: [id number]     Arrival date & time 12/19/13  1639 History  This chart was scribed for Maudry Diego, MD by Roxan Diesel, ED scribe.  This patient was seen in room APA10/APA10 and the patient's care was started at 4:42 PM.   Chief Complaint  Patient presents with  . Abdominal Pain  . Testicle Pain  . Emesis    Patient is a 33 y.o. male presenting with abdominal pain. The history is provided by the patient. No language interpreter was used.  Abdominal Pain Pain location:  RLQ Pain radiates to:  Groin (right testicle) Pain severity:  Severe Onset quality:  Sudden Duration:  14 hours Chronicity:  New Context comment:  Got up from a lying position Associated symptoms: nausea and vomiting   Associated symptoms: no chest pain, no cough, no fatigue and no hematuria     HPI Comments: Shane Mayo is a 33 y.o. male who presents to the Emergency Department complaining of severe RLQ abdominal pain that began this morning at 2:30 AM.  Pt states his pain came on suddenly when he got up from a lying position.  Pain radiates to right testicle.  It is worsened by palpation of the area.  He has associated severe nausea and vomiting.  He denies back pain.  Pt has h/o appendectomy.   Past Medical History  Diagnosis Date  . Herniated cervical disc     Past Surgical History  Procedure Laterality Date  . Appendectomy    . Knee arthroscopy      History reviewed. No pertinent family history.   History  Substance Use Topics  . Smoking status: Current Every Day Smoker -- 1.00 packs/day    Types: Cigarettes  . Smokeless tobacco: Not on file  . Alcohol Use: No     Review of Systems  Constitutional: Negative for appetite change and fatigue.  HENT: Negative for congestion, ear discharge and sinus pressure.   Eyes: Negative for discharge.  Respiratory: Negative for cough.   Cardiovascular: Negative for chest pain.  Gastrointestinal: Positive for nausea, vomiting and abdominal  pain.  Genitourinary: Positive for testicular pain. Negative for frequency and hematuria.  Musculoskeletal: Negative for back pain.  Skin: Negative for rash.  Neurological: Negative for seizures and headaches.  Psychiatric/Behavioral: Negative for hallucinations.      Allergies  Review of patient's allergies indicates no known allergies.  Home Medications   Prior to Admission medications   Medication Sig Start Date End Date Taking? Authorizing Provider  acetaminophen (TYLENOL) 500 MG tablet Take 500 mg by mouth every 6 (six) hours as needed for pain.    Historical Provider, MD  ciprofloxacin (CIPRO) 500 MG tablet Take 500 mg by mouth 2 (two) times daily. Take for 10 days    Historical Provider, MD  dicyclomine (BENTYL) 20 MG tablet Take 1 tablet (20 mg total) by mouth every 6 (six) hours as needed (abdominal pain). 01/12/13   Orpah Greek, MD  doxycycline (VIBRA-TABS) 100 MG tablet Take 100 mg by mouth 2 (two) times daily. Take for 10 days    Historical Provider, MD  naproxen sodium (ALEVE) 220 MG tablet Take 220 mg by mouth 3 (three) times daily as needed (pain).    Historical Provider, MD  traMADol (ULTRAM) 50 MG tablet Take 1 tablet (50 mg total) by mouth every 6 (six) hours as needed for pain. 01/12/13   Orpah Greek, MD   BP 156/104  Pulse 91  Resp 20  SpO2  100%  Physical Exam  Nursing note and vitals reviewed. Constitutional: He is oriented to person, place, and time. He appears well-developed.  Perspiring, in severe pain  HENT:  Head: Normocephalic.  Eyes: Conjunctivae and EOM are normal. No scleral icterus.  Neck: Neck supple. No thyromegaly present.  Cardiovascular: Normal rate and regular rhythm.  Exam reveals no gallop and no friction rub.   No murmur heard. Pulmonary/Chest: No stridor. He has no wheezes. He has no rales. He exhibits no tenderness.  Abdominal: He exhibits no distension. There is tenderness. There is no rebound.  RLQ mildly tender to  palpation  Musculoskeletal: Normal range of motion. He exhibits no edema.  Lymphadenopathy:    He has no cervical adenopathy.  Neurological: He is oriented to person, place, and time. He exhibits normal muscle tone. Coordination normal.  Skin: No rash noted. No erythema.  Psychiatric: He has a normal mood and affect. His behavior is normal.    ED Course  Procedures (including critical care time)  DIAGNOSTIC STUDIES: Oxygen Saturation is 100% on room air, normal by my interpretation.    COORDINATION OF CARE: 4:44 PM-Discussed treatment plan which includes IV fluids, pain medication, anti-emetics, CT-abdomen, and labs with pt at bedside and pt agreed to plan.   5:29 PM-On recheck pt is continuing to have improved but persistent mild pain.  Informed pt that imaging reveals a kidney stone.   Labs Review Labs Reviewed  CBC WITH DIFFERENTIAL - Abnormal; Notable for the following:    WBC 12.1 (*)    MCHC 36.2 (*)    Neutrophils Relative % 78 (*)    Neutro Abs 9.4 (*)    All other components within normal limits  BASIC METABOLIC PANEL  URINALYSIS, ROUTINE W REFLEX MICROSCOPIC  HEPATIC FUNCTION PANEL  LIPASE, BLOOD    Imaging Review Ct Abdomen Pelvis Wo Contrast  12/19/2013   CLINICAL DATA:  RIGHT lower quadrant pain radiating to test skull, nausea, vomiting, remote appendectomy as a child  EXAM: CT ABDOMEN AND PELVIS WITHOUT CONTRAST  TECHNIQUE: Multidetector CT imaging of the abdomen and pelvis was performed following the standard protocol without IV contrast. Sagittal and coronal MPR images reconstructed from axial data set. Oral contrast not administered.  COMPARISON:  01/12/2013  FINDINGS: Lung bases clear.  Mild fatty infiltration of liver with minimal focal sparing adjacent to gallbladder fossa.  Nonobstructing 6 mm calculus mid RIGHT kidney.  Mild RIGHT hydronephrosis and hydroureter secondary to a 2 mm diameter distal RIGHT ureteral calculus just above ureterovesical junction.   No additional urinary tract calcification.  Upper normal spleen size.  Within limits of a nonenhanced exam no additional focal abnormalities of the liver, spleen, pancreas, kidneys, adrenal glands or bladder identified.  Stomach and bowel loops normal appearance.  No mass, adenopathy, free fluid or inflammatory process.  Circumaortic LEFT renal vein noted.  Degenerative disc disease changes L5-S1.  IMPRESSION: Mild RIGHT hydronephrosis and hydroureter secondary to a 2 mm distal RIGHT ureteral calculus just above ureterovesical junction.  Additional nonobstructing 6 mm calculus mid RIGHT kidney.  Fatty infiltration of liver.   Electronically Signed   By: Lavonia Dana M.D.   On: 12/19/2013 17:16     EKG Interpretation None      MDM   Final diagnoses:  None    Kidney stone  The chart was scribed for me under my direct supervision.  I personally performed the history, physical, and medical decision making and all procedures in the evaluation of this patient.Marland Kitchen  Maudry Diego, MD 12/19/13 2008

## 2013-12-19 NOTE — Discharge Instructions (Signed)
Follow up with your md this week. °

## 2013-12-27 MED FILL — Oxycodone w/ Acetaminophen Tab 5-325 MG: ORAL | Qty: 6 | Status: AC

## 2014-01-27 ENCOUNTER — Emergency Department (HOSPITAL_COMMUNITY): Payer: BC Managed Care – PPO

## 2014-01-27 ENCOUNTER — Emergency Department (HOSPITAL_COMMUNITY)
Admission: EM | Admit: 2014-01-27 | Discharge: 2014-01-28 | Disposition: A | Discharge status: Other Acute Inpatient Hospital | Payer: BC Managed Care – PPO | Attending: Emergency Medicine | Admitting: Emergency Medicine

## 2014-01-27 ENCOUNTER — Encounter (HOSPITAL_COMMUNITY): Payer: Self-pay | Admitting: Emergency Medicine

## 2014-01-27 DIAGNOSIS — F17200 Nicotine dependence, unspecified, uncomplicated: Secondary | ICD-10-CM | POA: Insufficient documentation

## 2014-01-27 DIAGNOSIS — R112 Nausea with vomiting, unspecified: Secondary | ICD-10-CM | POA: Diagnosis not present

## 2014-01-27 DIAGNOSIS — N201 Calculus of ureter: Secondary | ICD-10-CM

## 2014-01-27 DIAGNOSIS — R34 Anuria and oliguria: Secondary | ICD-10-CM | POA: Insufficient documentation

## 2014-01-27 DIAGNOSIS — Z79899 Other long term (current) drug therapy: Secondary | ICD-10-CM | POA: Diagnosis not present

## 2014-01-27 DIAGNOSIS — Z87448 Personal history of other diseases of urinary system: Secondary | ICD-10-CM | POA: Diagnosis not present

## 2014-01-27 DIAGNOSIS — N1330 Unspecified hydronephrosis: Secondary | ICD-10-CM

## 2014-01-27 DIAGNOSIS — Z87442 Personal history of urinary calculi: Secondary | ICD-10-CM | POA: Insufficient documentation

## 2014-01-27 DIAGNOSIS — R109 Unspecified abdominal pain: Secondary | ICD-10-CM | POA: Diagnosis present

## 2014-01-27 HISTORY — DX: Calculus of kidney: N20.0

## 2014-01-27 HISTORY — DX: Disorder of kidney and ureter, unspecified: N28.9

## 2014-01-27 LAB — URINALYSIS, ROUTINE W REFLEX MICROSCOPIC
Glucose, UA: NEGATIVE mg/dL
Ketones, ur: >80 mg/dL — AB
Leukocytes, UA: NEGATIVE
NITRITE: NEGATIVE
PH: 6.5 (ref 5.0–8.0)
Protein, ur: 30 mg/dL — AB
SPECIFIC GRAVITY, URINE: 1.02 (ref 1.005–1.030)
UROBILINOGEN UA: 1 mg/dL (ref 0.0–1.0)

## 2014-01-27 LAB — BASIC METABOLIC PANEL
ANION GAP: 17 — AB (ref 5–15)
BUN: 23 mg/dL (ref 6–23)
CO2: 23 meq/L (ref 19–32)
Calcium: 9.9 mg/dL (ref 8.4–10.5)
Chloride: 98 mEq/L (ref 96–112)
Creatinine, Ser: 1.37 mg/dL — ABNORMAL HIGH (ref 0.50–1.35)
GFR calc non Af Amer: 67 mL/min — ABNORMAL LOW (ref >90)
GFR, EST AFRICAN AMERICAN: 78 mL/min — AB (ref >90)
Glucose, Bld: 135 mg/dL — ABNORMAL HIGH (ref 70–99)
POTASSIUM: 4.4 meq/L (ref 3.7–5.3)
Sodium: 138 mEq/L (ref 137–147)

## 2014-01-27 LAB — CBC WITH DIFFERENTIAL/PLATELET
BASOS ABS: 0 10*3/uL (ref 0.0–0.1)
BASOS PCT: 0 % (ref 0–1)
Eosinophils Absolute: 0.3 10*3/uL (ref 0.0–0.7)
Eosinophils Relative: 2 % (ref 0–5)
HEMATOCRIT: 44 % (ref 39.0–52.0)
HEMOGLOBIN: 15.7 g/dL (ref 13.0–17.0)
LYMPHS PCT: 18 % (ref 12–46)
Lymphs Abs: 2.3 10*3/uL (ref 0.7–4.0)
MCH: 31.8 pg (ref 26.0–34.0)
MCHC: 35.7 g/dL (ref 30.0–36.0)
MCV: 89.1 fL (ref 78.0–100.0)
MONOS PCT: 9 % (ref 3–12)
Monocytes Absolute: 1.1 10*3/uL — ABNORMAL HIGH (ref 0.1–1.0)
NEUTROS ABS: 9.3 10*3/uL — AB (ref 1.7–7.7)
Neutrophils Relative %: 71 % (ref 43–77)
Platelets: 226 10*3/uL (ref 150–400)
RBC: 4.94 MIL/uL (ref 4.22–5.81)
RDW: 11.9 % (ref 11.5–15.5)
WBC: 13.1 10*3/uL — AB (ref 4.0–10.5)

## 2014-01-27 LAB — URINE MICROSCOPIC-ADD ON

## 2014-01-27 MED ORDER — SODIUM CHLORIDE 0.9 % IV SOLN
INTRAVENOUS | Status: DC
  Administered 2014-01-27: 1000 mL via INTRAVENOUS

## 2014-01-27 MED ORDER — HYDROMORPHONE HCL PF 1 MG/ML IJ SOLN
1.0000 mg | Freq: Once | INTRAMUSCULAR | Status: AC
  Administered 2014-01-27: 1 mg via INTRAVENOUS
  Filled 2014-01-27: qty 1

## 2014-01-27 MED ORDER — ONDANSETRON HCL 4 MG/2ML IJ SOLN
4.0000 mg | Freq: Once | INTRAMUSCULAR | Status: AC
  Administered 2014-01-27: 4 mg via INTRAVENOUS
  Filled 2014-01-27: qty 2

## 2014-01-27 MED ORDER — HYDROMORPHONE HCL PF 1 MG/ML IJ SOLN
1.0000 mg | Freq: Once | INTRAMUSCULAR | Status: AC
Start: 2014-01-27 — End: 2014-01-27
  Administered 2014-01-27: 1 mg via INTRAVENOUS
  Filled 2014-01-27: qty 1

## 2014-01-27 MED ORDER — SODIUM CHLORIDE 0.9 % IV BOLUS (SEPSIS)
2000.0000 mL | Freq: Once | INTRAVENOUS | Status: AC
  Administered 2014-01-27: 1000 mL via INTRAVENOUS

## 2014-01-27 NOTE — ED Provider Notes (Signed)
CSN: [id number]     Arrival date & time 01/27/14  1759 History   First MD Initiated Contact with Patient 01/27/14 1810     Chief Complaint  Patient presents with  . Abdominal Pain     (Consider location/radiation/quality/duration/timing/severity/associated sxs/prior Treatment) HPI 33 year old male presents with worsening right-sided abdominal and flank pain since he had lithotripsy 2 days ago. This was performed by Dr. Odis Luster at [facility]. The patient states he had a 10 mm proximal stone. Since then his been having nausea vomiting and difficulty urinating. Denies any fevers. Has been taking hydrocodone without relief. Has been trying to drink more fluids without any relief. He states he called his urologist recommended he keep up fluid intake. He is currently rate the pain as a severe 10 out of 10. The pain is radiating into his right testicle.  Past Medical History  Diagnosis Date  . Herniated cervical disc   . Renal disorder   . Kidney stone    Past Surgical History  Procedure Laterality Date  . Appendectomy    . Knee arthroscopy    . Lithotripsy     History reviewed. No pertinent family history. History  Substance Use Topics  . Smoking status: Current Every Day Smoker -- 1.00 packs/day    Types: Cigarettes  . Smokeless tobacco: Not on file  . Alcohol Use: No    Review of Systems  Constitutional: Negative for fever.  Gastrointestinal: Positive for nausea, vomiting and abdominal pain.  Genitourinary: Positive for flank pain and decreased urine volume.  All other systems reviewed and are negative.     Allergies  Review of patient's allergies indicates no known allergies.  Home Medications   Prior to Admission medications   Medication Sig Start Date End Date Taking? Authorizing Provider  acetaminophen (TYLENOL) 500 MG tablet Take 500 mg by mouth every 6 (six) hours as needed for pain.    Historical Provider, MD  oxyCODONE-acetaminophen (PERCOCET) 5-325 MG per tablet  Take 1 tablet by mouth every 6 (six) hours as needed. 12/19/13   Maudry Diego, MD  oxyCODONE-acetaminophen (PERCOCET) 5-325 MG per tablet Take 1 tablet by mouth every 6 (six) hours as needed. 12/19/13   Maudry Diego, MD  oxyCODONE-acetaminophen (PERCOCET/ROXICET) 5-325 MG per tablet Take 1 tablet by mouth every 6 (six) hours as needed. 12/19/13   Maudry Diego, MD  oxyCODONE-acetaminophen (PERCOCET/ROXICET) 5-325 MG per tablet Take 1 tablet by mouth every 6 (six) hours as needed. 12/19/13   Maudry Diego, MD  promethazine (PHENERGAN) 25 MG tablet Take 1 tablet (25 mg total) by mouth every 6 (six) hours as needed for nausea or vomiting. 12/19/13   Maudry Diego, MD  tamsulosin (FLOMAX) 0.4 MG CAPS capsule Take 1 capsule (0.4 mg total) by mouth daily. 12/19/13   Maudry Diego, MD   BP 153/92  Pulse 99  Temp(Src) 97.7 F (36.5 C) (Oral)  Resp 26  Ht 6\' 3"  (1.905 m)  Wt 250 lb (113.399 kg)  BMI 31.25 kg/m2  SpO2 100% Physical Exam  Nursing note and vitals reviewed. Constitutional: He is oriented to person, place, and time. He appears well-developed and well-nourished.  HENT:  Head: Normocephalic and atraumatic.  Right Ear: External ear normal.  Left Ear: External ear normal.  Nose: Nose normal.  Eyes: Right eye exhibits no discharge. Left eye exhibits no discharge.  Neck: Neck supple.  Cardiovascular: Normal rate, regular rhythm, normal heart sounds and intact distal pulses.   Pulmonary/Chest: Effort normal.  Abdominal: Soft. There is tenderness in the right lower quadrant.    Musculoskeletal: He exhibits no edema.  Neurological: He is alert and oriented to person, place, and time.  Skin: Skin is warm. He is diaphoretic.    ED Course  Procedures (including critical care time) Labs Review Labs Reviewed  CBC WITH DIFFERENTIAL - Abnormal; Notable for the following:    WBC 13.1 (*)    Neutro Abs 9.3 (*)    Monocytes Absolute 1.1 (*)    All other components within normal  limits  BASIC METABOLIC PANEL - Abnormal; Notable for the following:    Glucose, Bld 135 (*)    Creatinine, Ser 1.37 (*)    GFR calc non Af Amer 67 (*)    GFR calc Af Amer 78 (*)    Anion gap 17 (*)    All other components within normal limits  URINALYSIS, ROUTINE W REFLEX MICROSCOPIC - Abnormal; Notable for the following:    Hgb urine dipstick SMALL (*)    Bilirubin Urine SMALL (*)    Ketones, ur >80 (*)    Protein, ur 30 (*)    All other components within normal limits  URINE MICROSCOPIC-ADD ON - Abnormal; Notable for the following:    Bacteria, UA FEW (*)    Crystals CA OXALATE CRYSTALS (*)    All other components within normal limits    Imaging Review Ct Abdomen Pelvis Wo Contrast  01/27/2014   CLINICAL DATA:  Right flank pain.  History of kidney stones.  EXAM: CT ABDOMEN AND PELVIS WITHOUT CONTRAST  TECHNIQUE: Multidetector CT imaging of the abdomen and pelvis was performed following the standard protocol without IV contrast.  COMPARISON:  12/19/2013  FINDINGS: There is atelectasis in the lower lobes bilaterally. No evidence for free air.  Low-density of the liver is consistent with hepatic steatosis. Normal appearance of the gallbladder, spleen, stomach, duodenum, pancreas and right adrenal gland. There is a stable punctate low-density area near the pancreatic head which is nonspecific and has not changed since 01/12/2013. A small adenoma involving the left adrenal gland appears unchanged.  Normal appearance of the left kidney without stones or hydronephrosis. There are at least 2 adjacent stones at the right ureterovesical junction. Difficult to measure the size of the stones. Edema and dilatation of the right ureter with moderate right hydronephrosis. Right kidney appears to be edematous with mild perinephric edema. Incidentally, the patient has a retroaortic left renal vein. No significant free fluid. Normal appearance of the prostate gland, colon and small bowel. The appendix appears  to be surgically removed.  No acute bone abnormality.  Disc space disease at L5-S1.  IMPRESSION: Moderate right hydroureteronephrosis due to stones at the right ureterovesical junction. There are at least 2 adjacent small stones in this region.  Hepatic steatosis.   Electronically Signed   By: Markus Daft M.D.   On: 01/27/2014 21:10   Dg Abd 1 View  01/27/2014   CLINICAL DATA:  Flank pain.  EXAM: ABDOMEN - 1 VIEW  COMPARISON:  CT 12/19/2013.  FINDINGS: Soft tissue structures are unremarkable. The gas pattern is nonspecific. Vascular calcification including calcified pelvic phleboliths are noted. Distal ureteral stone as noted on recent CT cannot be excluded. No acute bony abnormality.  IMPRESSION: Focal calcifications in pelvis most consistent with phleboliths. Distal ureteral stone as noted on prior CT of 12/19/2013 cannot be excluded.   Electronically Signed   By: Marcello Moores  Register   On: 01/27/2014 19:57  EKG Interpretation None     Angiocath insertion Performed by: Sherwood Gambler T  Consent: Verbal consent obtained. Risks and benefits: risks, benefits and alternatives were discussed Time out: Immediately prior to procedure a "time out" was called to verify the correct patient, procedure, equipment, support staff and site/side marked as required.  Preparation: Patient was prepped and draped in the usual sterile fashion.  Vein Location: left basilic  Ultrasound Guided  Gauge: 20  Normal blood return and flush without difficulty Patient tolerance: Patient tolerated the procedure well with no immediate complications.     MDM   Final diagnoses:  Right ureteral stone  Hydronephrosis, right    Patient with poor pain control, due to this a CT scan was obtained. Shows moderate right hydronephrosis with multiple stones in the right UVJ. Patient's pain is somewhat improved but pain not adequately controlled. D/w urology at [facility], they recommend transfer to ED as he will likely need  removal or stenting. D/w ED, Dr. Bertram Savin, who accepts in transfer. Patient will be kept NPO.   Ephraim Hamburger, MD 01/27/14 2135

## 2014-01-27 NOTE — ED Notes (Signed)
Pt moaning and restless, medicated as noted

## 2014-01-27 NOTE — ED Notes (Signed)
Pt reports has had lithotripsy on Tuesday. Pt reports Right flank pain radiating to right side of abdomen. Pt reports intense pain, difficulty initiating urine stream and n/v since Tuesday.

## 2014-01-27 NOTE — ED Notes (Signed)
Pt presents to ED with pain of right lower abdomen radiating to back. Pt underwent lithotripsy on 01/25/2014 and has been in constant pain ever since. States that he is "barely" able to urinate (weak stream). Pt states pain is 10/10 and is moaning constantly and sweating profusely. He has been able to keep some liquids down but not much food. Three episodes of emesis today.

## 2014-01-28 DIAGNOSIS — R109 Unspecified abdominal pain: Secondary | ICD-10-CM | POA: Diagnosis present

## 2014-01-28 DIAGNOSIS — Z87442 Personal history of urinary calculi: Secondary | ICD-10-CM | POA: Diagnosis not present

## 2014-01-28 DIAGNOSIS — N1330 Unspecified hydronephrosis: Secondary | ICD-10-CM | POA: Diagnosis not present

## 2014-01-28 DIAGNOSIS — N201 Calculus of ureter: Secondary | ICD-10-CM | POA: Diagnosis not present

## 2014-01-28 DIAGNOSIS — R112 Nausea with vomiting, unspecified: Secondary | ICD-10-CM | POA: Diagnosis not present

## 2014-01-28 DIAGNOSIS — R34 Anuria and oliguria: Secondary | ICD-10-CM | POA: Diagnosis not present

## 2014-01-28 DIAGNOSIS — F17200 Nicotine dependence, unspecified, uncomplicated: Secondary | ICD-10-CM | POA: Diagnosis not present

## 2014-01-28 DIAGNOSIS — Z87448 Personal history of other diseases of urinary system: Secondary | ICD-10-CM | POA: Diagnosis not present

## 2014-01-28 DIAGNOSIS — Z79899 Other long term (current) drug therapy: Secondary | ICD-10-CM | POA: Diagnosis not present

## 2014-12-01 ENCOUNTER — Other Ambulatory Visit (HOSPITAL_COMMUNITY): Payer: Self-pay | Admitting: Neurosurgery

## 2014-12-01 DIAGNOSIS — M5127 Other intervertebral disc displacement, lumbosacral region: Secondary | ICD-10-CM

## 2014-12-14 ENCOUNTER — Ambulatory Visit (HOSPITAL_COMMUNITY)
Admission: RE | Admit: 2014-12-14 | Discharge: 2014-12-14 | Disposition: A | Discharge status: Home / Self Care | Payer: BLUE CROSS/BLUE SHIELD | Source: Ambulatory Visit | Attending: Neurosurgery | Admitting: Neurosurgery

## 2014-12-14 DIAGNOSIS — M545 Low back pain: Secondary | ICD-10-CM | POA: Diagnosis present

## 2014-12-14 DIAGNOSIS — M5126 Other intervertebral disc displacement, lumbar region: Secondary | ICD-10-CM | POA: Insufficient documentation

## 2014-12-14 DIAGNOSIS — R200 Anesthesia of skin: Secondary | ICD-10-CM | POA: Insufficient documentation

## 2014-12-14 DIAGNOSIS — M5127 Other intervertebral disc displacement, lumbosacral region: Secondary | ICD-10-CM

## 2014-12-14 DIAGNOSIS — R531 Weakness: Secondary | ICD-10-CM | POA: Diagnosis not present

## 2015-06-11 HISTORY — PX: KNEE ARTHROSCOPY: SUR90

## 2018-10-19 ENCOUNTER — Encounter (HOSPITAL_COMMUNITY): Payer: Self-pay

## 2018-10-19 ENCOUNTER — Emergency Department (HOSPITAL_COMMUNITY)
Admission: EM | Admit: 2018-10-19 | Discharge: 2018-10-19 | Disposition: A | Discharge status: Home / Self Care | Payer: BLUE CROSS/BLUE SHIELD | Attending: Emergency Medicine | Admitting: Emergency Medicine

## 2018-10-19 ENCOUNTER — Emergency Department (HOSPITAL_COMMUNITY): Payer: BLUE CROSS/BLUE SHIELD

## 2018-10-19 ENCOUNTER — Other Ambulatory Visit: Payer: Self-pay

## 2018-10-19 DIAGNOSIS — Z79899 Other long term (current) drug therapy: Secondary | ICD-10-CM | POA: Diagnosis not present

## 2018-10-19 DIAGNOSIS — E119 Type 2 diabetes mellitus without complications: Secondary | ICD-10-CM | POA: Diagnosis not present

## 2018-10-19 DIAGNOSIS — R05 Cough: Secondary | ICD-10-CM | POA: Diagnosis present

## 2018-10-19 DIAGNOSIS — K529 Noninfective gastroenteritis and colitis, unspecified: Secondary | ICD-10-CM | POA: Insufficient documentation

## 2018-10-19 DIAGNOSIS — F17210 Nicotine dependence, cigarettes, uncomplicated: Secondary | ICD-10-CM | POA: Diagnosis not present

## 2018-10-19 DIAGNOSIS — R059 Cough, unspecified: Secondary | ICD-10-CM

## 2018-10-19 LAB — COMPREHENSIVE METABOLIC PANEL
ALT: 22 U/L (ref 0–44)
AST: 19 U/L (ref 15–41)
Albumin: 4 g/dL (ref 3.5–5.0)
Alkaline Phosphatase: 83 U/L (ref 38–126)
Anion gap: 11 (ref 5–15)
BUN: 8 mg/dL (ref 6–20)
CO2: 22 mmol/L (ref 22–32)
Calcium: 9.1 mg/dL (ref 8.9–10.3)
Chloride: 102 mmol/L (ref 98–111)
Creatinine, Ser: 0.59 mg/dL — ABNORMAL LOW (ref 0.61–1.24)
GFR calc Af Amer: >60 mL/min (ref >60)
GFR calc non Af Amer: >60 mL/min (ref >60)
Glucose, Bld: 273 mg/dL — ABNORMAL HIGH (ref 70–99)
Potassium: 3.6 mmol/L (ref 3.5–5.1)
Sodium: 135 mmol/L (ref 135–145)
Total Bilirubin: 0.4 mg/dL (ref 0.3–1.2)
Total Protein: 7.7 g/dL (ref 6.5–8.1)

## 2018-10-19 LAB — URINALYSIS, ROUTINE W REFLEX MICROSCOPIC
Bacteria, UA: NONE SEEN
Bilirubin Urine: NEGATIVE
Glucose, UA: >=500 mg/dL — AB
Hgb urine dipstick: NEGATIVE
Ketones, ur: NEGATIVE mg/dL
Leukocytes,Ua: NEGATIVE
Nitrite: NEGATIVE
Protein, ur: NEGATIVE mg/dL
Specific Gravity, Urine: 1.021 (ref 1.005–1.030)
pH: 6 (ref 5.0–8.0)

## 2018-10-19 LAB — DIFFERENTIAL
Abs Immature Granulocytes: 0.02 10*3/uL (ref 0.00–0.07)
Basophils Absolute: 0 10*3/uL (ref 0.0–0.1)
Basophils Relative: 1 %
Eosinophils Absolute: 0.3 10*3/uL (ref 0.0–0.5)
Eosinophils Relative: 4 %
Immature Granulocytes: 0 %
Lymphocytes Relative: 31 %
Lymphs Abs: 2.5 10*3/uL (ref 0.7–4.0)
Monocytes Absolute: 0.5 10*3/uL (ref 0.1–1.0)
Monocytes Relative: 7 %
Neutro Abs: 4.6 10*3/uL (ref 1.7–7.7)
Neutrophils Relative %: 57 %

## 2018-10-19 LAB — CBC
HCT: 48.1 % (ref 39.0–52.0)
Hemoglobin: 16.5 g/dL (ref 13.0–17.0)
MCH: 31.1 pg (ref 26.0–34.0)
MCHC: 34.3 g/dL (ref 30.0–36.0)
MCV: 90.6 fL (ref 80.0–100.0)
Platelets: 259 10*3/uL (ref 150–400)
RBC: 5.31 MIL/uL (ref 4.22–5.81)
RDW: 12.1 % (ref 11.5–15.5)
WBC: 8.1 10*3/uL (ref 4.0–10.5)
nRBC: 0 % (ref 0.0–0.2)

## 2018-10-19 LAB — LIPASE, BLOOD: Lipase: 36 U/L (ref 11–51)

## 2018-10-19 MED ORDER — ONDANSETRON HCL 4 MG/2ML IJ SOLN
4.0000 mg | Freq: Once | INTRAMUSCULAR | Status: AC
  Administered 2018-10-19: 4 mg via INTRAVENOUS
  Filled 2018-10-19: qty 2

## 2018-10-19 MED ORDER — DIPHENOXYLATE-ATROPINE 2.5-0.025 MG PO TABS
2.0000 | ORAL_TABLET | Freq: Once | ORAL | Status: AC
  Administered 2018-10-19: 16:00:00 2 via ORAL
  Filled 2018-10-19: qty 2

## 2018-10-19 MED ORDER — ONDANSETRON 4 MG PO TBDP: 4.0000 mg | ORAL_TABLET | Freq: Three times a day (TID) | ORAL | 0 refills | Status: DC | PRN

## 2018-10-19 MED ORDER — SODIUM CHLORIDE 0.9 % IV BOLUS
1000.0000 mL | Freq: Once | INTRAVENOUS | Status: AC
  Administered 2018-10-19: 15:00:00 1000 mL via INTRAVENOUS

## 2018-10-19 MED ORDER — SODIUM CHLORIDE 0.9% FLUSH
3.0000 mL | Freq: Once | INTRAVENOUS | Status: AC
  Administered 2018-10-19: 15:00:00 3 mL via INTRAVENOUS

## 2018-10-19 MED ORDER — GLIPIZIDE 5 MG PO TABS: 5.0000 mg | ORAL_TABLET | Freq: Every day | ORAL | 0 refills | Status: DC

## 2018-10-19 MED ORDER — BENZONATATE 100 MG PO CAPS: 200.0000 mg | ORAL_CAPSULE | Freq: Three times a day (TID) | ORAL | 0 refills | Status: DC | PRN

## 2018-10-19 MED ORDER — FAMOTIDINE IN NACL 20-0.9 MG/50ML-% IV SOLN
20.0000 mg | Freq: Once | INTRAVENOUS | Status: AC
  Administered 2018-10-19: 15:00:00 20 mg via INTRAVENOUS
  Filled 2018-10-19: qty 50

## 2018-10-19 MED ORDER — DIPHENOXYLATE-ATROPINE 2.5-0.025 MG PO TABS: 1.0000 | ORAL_TABLET | Freq: Four times a day (QID) | ORAL | 0 refills | Status: DC | PRN

## 2018-10-19 NOTE — ED Triage Notes (Signed)
Pt presents to ED with complaints of generalized abdominal pain since Tuesday evening. Pt also complaining of diarrhea. Pt states he has had some vomiting. Pt also complaining of productive cough since Friday and states he hurts in his chest when he coughs

## 2018-10-19 NOTE — Discharge Instructions (Signed)
Use the medicines prescribed for your symptoms of diarrhea, nausea and cough.  Make sure you are drinking plenty of fluids.  As discussed, your blood glucose level today is 273 which is elevated (normal range 70-110) which does give you a diagnosis of diabetes.  Call Dr. Willey Blade for an office visit within the next week for a recheck of your symptoms and also for further evaluation and management of this new diabetes diagnosis.

## 2018-10-19 NOTE — ED Provider Notes (Signed)
[facility] EMERGENCY DEPARTMENT Provider Note   CSN: [id number] Arrival date & time: 10/19/18  1311    History   Chief Complaint Chief Complaint  Patient presents with  . Abdominal Pain  . Cough    HPI Shane Mayo is a 38 y.o. male with no pertinent past medical history presenting with a one week history of cough productive of yellow with brown tinged sputum along with bilateral anterior rib pain triggered by cough and intermittent sob, but has been afebrile. Also reports nasal congestion.  He is a 1-1.5 ppd smoker.  Additionally he reports nausea with several episodes of emesis last week when his sx began and has had persistent intermittent non bloody diarrhea also starting one week ago. He reports can tolerate po fluids but any solid food intake triggers a bm.  He denies urinary sx or decreased urinary production, has been afebrile. He reports churning/burning type pain in his abd make worse by need to have a bm.  He has taken dayquil/nyquil with some improvement in cough. Has tried pepto bismol which has not relieved his diarrhea.  He denies exposures to others with similar sx.  No recent travel or abx use.      The history is provided by the patient.    Past Medical History:  Diagnosis Date  . Herniated cervical disc   . Kidney stone   . Renal disorder     There are no active problems to display for this patient.   Past Surgical History:  Procedure Laterality Date  . APPENDECTOMY    . KNEE ARTHROSCOPY    . LITHOTRIPSY          Home Medications    Prior to Admission medications   Medication Sig Start Date End Date Taking? Authorizing Provider  acetaminophen (TYLENOL) 500 MG tablet Take 500 mg by mouth every 6 (six) hours as needed for pain.   Yes [provider]  benzonatate (TESSALON) 100 MG capsule Take 2 capsules (200 mg total) by mouth 3 (three) times daily as needed for cough. 10/19/18   Evalee Jefferson, PA-C  diphenoxylate-atropine (LOMOTIL)  2.5-0.025 MG tablet Take 1 tablet by mouth 4 (four) times daily as needed for diarrhea or loose stools. 10/19/18   Evalee Jefferson, PA-C  glipiZIDE (GLUCOTROL) 5 MG tablet Take 1 tablet (5 mg total) by mouth daily before breakfast. 10/19/18   Makennah Omura, Almyra Free, PA-C  ondansetron (ZOFRAN ODT) 4 MG disintegrating tablet Take 1 tablet (4 mg total) by mouth every 8 (eight) hours as needed for nausea or vomiting. 10/19/18   Orley Lawry, Almyra Free, PA-C  tamsulosin (FLOMAX) 0.4 MG CAPS capsule Take 1 capsule (0.4 mg total) by mouth daily. Patient not taking: Reported on 10/19/2018 12/19/13   Milton Ferguson, MD    Family History No family history on file.  Social History Social History   Tobacco Use  . Smoking status: Current Every Day Smoker    Packs/day: 1.00    Types: Cigarettes  . Smokeless tobacco: Never Used  Substance Use Topics  . Alcohol use: Yes  . Drug use: No     Allergies   Patient has no known allergies.   Review of Systems Review of Systems  Constitutional: Negative for chills and fever.  HENT: Negative for congestion and sore throat.   Eyes: Negative.   Respiratory: Positive for cough and shortness of breath. Negative for chest tightness and wheezing.   Cardiovascular: Negative for chest pain.  Gastrointestinal: Positive for abdominal pain, diarrhea, nausea and  vomiting.  Genitourinary: Negative.  Negative for dysuria.  Musculoskeletal: Negative for arthralgias, joint swelling and neck pain.  Skin: Negative.  Negative for rash and wound.  Neurological: Negative for dizziness, weakness, light-headedness, numbness and headaches.  Psychiatric/Behavioral: Negative.      Physical Exam Updated Vital Signs BP 133/75 (BP Location: Left Arm)   Pulse 68   Temp 98.1 F (36.7 C) (Oral)   Resp 16   Ht 6\' 3"  (1.905 m)   Wt 94.3 kg   SpO2 100%   BMI 26.00 kg/m   Physical Exam Vitals signs and nursing note reviewed.  Constitutional:      Appearance: He is well-developed.  HENT:     Head:  Normocephalic and atraumatic.  Eyes:     Conjunctiva/sclera: Conjunctivae normal.  Neck:     Musculoskeletal: Normal range of motion.  Cardiovascular:     Rate and Rhythm: Normal rate and regular rhythm.     Heart sounds: Normal heart sounds.  Pulmonary:     Effort: Pulmonary effort is normal.     Breath sounds: Normal breath sounds. No decreased breath sounds, wheezing, rhonchi or rales.  Abdominal:     General: Bowel sounds are normal. There is no distension.     Palpations: Abdomen is soft.     Tenderness: There is generalized abdominal tenderness. There is no guarding or rebound.  Musculoskeletal: Normal range of motion.  Skin:    General: Skin is warm and dry.  Neurological:     Mental Status: He is alert.      ED Treatments / Results  Labs (all labs ordered are listed, but only abnormal results are displayed) Labs Reviewed  COMPREHENSIVE METABOLIC PANEL - Abnormal; Notable for the following components:      Result Value   Glucose, Bld 273 (*)    Creatinine, Ser 0.59 (*)    All other components within normal limits  URINALYSIS, ROUTINE W REFLEX MICROSCOPIC - Abnormal; Notable for the following components:   Glucose, UA >=500 (*)    All other components within normal limits  LIPASE, BLOOD  CBC  DIFFERENTIAL    EKG None  Radiology Dg Chest Portable 1 View  Result Date: 10/19/2018 CLINICAL DATA:  Abdominal pain for 6 days. Productive cough for 4 days. EXAM: PORTABLE CHEST 1 VIEW COMPARISON:  PA and lateral chest 04/02/2012. FINDINGS: The lungs are clear. Heart size is normal. No pneumothorax or pleural fluid. No acute or focal bony abnormality. IMPRESSION: Negative chest. Electronically Signed   By: Inge Rise M.D.   On: 10/19/2018 15:30    Procedures Procedures (including critical care time)  Medications Ordered in ED Medications  sodium chloride flush (NS) 0.9 % injection 3 mL (3 mLs Intravenous Given 10/19/18 1456)  sodium chloride 0.9 % bolus 1,000  mL (0 mLs Intravenous Stopped 10/19/18 1611)  ondansetron (ZOFRAN) injection 4 mg (4 mg Intravenous Given 10/19/18 1456)  famotidine (PEPCID) IVPB 20 mg premix (0 mg Intravenous Stopped 10/19/18 1525)  diphenoxylate-atropine (LOMOTIL) 2.5-0.025 MG per tablet 2 tablet (2 tablets Oral Given 10/19/18 1611)     Initial Impression / Assessment and Plan / ED Course  I have reviewed the triage vital signs and the nursing notes.  Pertinent labs & imaging results that were available during my care of the patient were reviewed by me and considered in my medical decision making (see chart for details).        Pt with suspected viral gastroenteritis, with incidental finding of hyperglycemia. Findings discussed  with pt who endorses father has DM, diagnosed in his 45's.  He does report increased thirst and urinary frequency in the recent past. He was started on glucotrol with instructions for close f/u with pcp for a recheck within the next week.  Also prescribed lomotil, zofran tessalon for sx relief.  CXR clear, no pneumonia. Afebrile, no sob, doubt covid as source of cough.  He had no diarrhea while in the dept, advised to continue lomotil only if diarrhea continues.   Final Clinical Impressions(s) / ED Diagnoses   Final diagnoses:  Gastroenteritis  Cough  New onset type 2 diabetes mellitus [facility])    ED Discharge Orders         Ordered    diphenoxylate-atropine (LOMOTIL) 2.5-0.025 MG tablet  4 times daily PRN     10/19/18 1625    ondansetron (ZOFRAN ODT) 4 MG disintegrating tablet  Every 8 hours PRN     10/19/18 1625    glipiZIDE (GLUCOTROL) 5 MG tablet  Daily before breakfast     10/19/18 1625    benzonatate (TESSALON) 100 MG capsule  3 times daily PRN     10/19/18 1630           Landis Martins 10/19/18 1727    Noemi Chapel, MD 10/20/18 1415

## 2018-10-23 ENCOUNTER — Other Ambulatory Visit: Payer: Self-pay

## 2018-10-23 ENCOUNTER — Other Ambulatory Visit: Payer: BLUE CROSS/BLUE SHIELD

## 2018-10-23 ENCOUNTER — Telehealth: Payer: Self-pay | Admitting: *Deleted

## 2018-10-23 DIAGNOSIS — Z20822 Contact with and (suspected) exposure to covid-19: Secondary | ICD-10-CM

## 2018-10-23 NOTE — Telephone Encounter (Signed)
Tonya for Dr. Ria Comment office called regarding a request for covid testing on a patient. Pt has symptoms of a cough,  having body aches, GI symptoms, fever and has diabetes with complications.  Will notify the patient.  Pt called and scheduled an appointment for today  10/23/18 for covid-19 testing. Pt advised of wearing a mask, staying in the car with the windows rolled up until he is tested. Pt voiced understanding.

## 2018-10-26 LAB — NOVEL CORONAVIRUS, NAA: SARS-CoV-2, NAA: NOT DETECTED

## 2018-11-05 LAB — NOVEL CORONAVIRUS, NAA: SARS-CoV-2, NAA: NOT DETECTED

## 2019-05-11 DIAGNOSIS — E785 Hyperlipidemia, unspecified: Secondary | ICD-10-CM | POA: Diagnosis not present

## 2019-05-11 DIAGNOSIS — Z79899 Other long term (current) drug therapy: Secondary | ICD-10-CM | POA: Diagnosis not present

## 2019-05-11 DIAGNOSIS — E118 Type 2 diabetes mellitus with unspecified complications: Secondary | ICD-10-CM | POA: Diagnosis not present

## 2019-05-12 DIAGNOSIS — K591 Functional diarrhea: Secondary | ICD-10-CM | POA: Diagnosis not present

## 2019-06-02 DIAGNOSIS — K591 Functional diarrhea: Secondary | ICD-10-CM | POA: Diagnosis not present

## 2019-06-15 ENCOUNTER — Encounter: Payer: Self-pay | Admitting: Internal Medicine

## 2019-07-01 ENCOUNTER — Encounter: Payer: Self-pay | Admitting: Gastroenterology

## 2019-07-01 NOTE — Progress Notes (Signed)
Primary Care Physician:  Asencion Noble, MD  Primary Gastroenterologist:  Garfield Cornea, MD   Chief Complaint  Patient presents with  . Diarrhea    usually daily, yellow; has seen bright red blood  . Abdominal Pain    all over but mostly left side; feels gassy    HPI:  Shane Mayo is a 39 y.o. male here at the request of Dr. Willey Blade for further evaluation of diarrhea.  Patient started having diarrhea in last 03/2019. He has had similar episodes in the past but nothing that has lasted more than few days. He cannot recall last solid stool. Some nocturnal stools. Intermittent brbpr. Complains of postprandial urgency, and limits intake due to this. He has had some nausea. Currently having two loose stools daily. He complains of epigastric pain associated with heartburn, regurgitation which is all new in the past several months.  Some crampy abdominal pain with diarrhea.  Lots of abdominal noises, feels like he is full of gas.  No dietary or medication changes at time of onset of symptoms.  He has tried about 4 different medications for diarrhea.  He has been on Lomotil, dicyclomine without relief.  No antibiotics.  He does not remember the other 2 medications.  He was diagnosed with diabetes mellitus, type II in May 2020.  Initially placed on Metformin 1000 mg twice daily.  Recently this was reduced to 500 mg twice daily to see if it would help his diarrhea. No significant improvement.  He is now also on glipizide.  A1c worse with these changes, over 10 now.  Patient states he is lost down from 285 pounds since 2013.  Initially significant weight loss in order to cope with his back issues.  In the past year he is lost 25 pounds in the setting of undiagnosed diabetes.  Patient has been taking BC powders on occasion for headache.  Tries to limit.  No other NSAIDs.  Labs from 06/02/2019: White blood cell count 11,900, hemoglobin 17.4, platelets 308,000, glucose 214, creatinine 0.83, albumin 4.9, total  bilirubin 0.4, alkaline phosphatase 89, AST 15, ALT 29, CRP 4, sed rate 15.  GI profile negative.   Current Outpatient Medications  Medication Sig Dispense Refill  . glipiZIDE (GLUCOTROL) 5 MG tablet Take 1 tablet (5 mg total) by mouth daily before breakfast. 30 tablet 0  . metFORMIN (GLUCOPHAGE) 1000 MG tablet Take 500 mg by mouth 2 (two) times daily with a meal.     No current facility-administered medications for this visit.    Allergies as of 07/02/2019  . (No Known Allergies)    Past Medical History:  Diagnosis Date  . Diabetes ([place])   . Herniated cervical disc   . Kidney stone   . Renal disorder     Past Surgical History:  Procedure Laterality Date  . APPENDECTOMY  7  . BACK SURGERY  34  . KNEE ARTHROSCOPY  2017   ACL  . LITHOTRIPSY      Family History  Problem Relation Age of Onset  . Diabetes Father   . Colon cancer Neg Hx   . Celiac disease Neg Hx   . Inflammatory bowel disease Neg Hx     Social History   Socioeconomic History  . Marital status: Single    Spouse name: Not on file  . Number of children: Not on file  . Years of education: Not on file  . Highest education level: Not on file  Occupational History  . Not on file  Tobacco Use  . Smoking status: Current Every Day Smoker    Packs/day: 1.00    Types: Cigarettes  . Smokeless tobacco: Never Used  Substance and Sexual Activity  . Alcohol use: Not Currently    Comment: never heavy or daily drinking. every couple of months or so may have a drink  . Drug use: No  . Sexual activity: Not on file  Other Topics Concern  . Not on file  Social History Narrative  . Not on file   Social Determinants of Health   Financial Resource Strain:   . Difficulty of Paying Living Expenses: Not on file  Food Insecurity:   . Worried About Charity fundraiser in the Last Year: Not on file  . Ran Out of Food in the Last Year: Not on file  Transportation Needs:   . Lack of Transportation (Medical): Not on  file  . Lack of Transportation (Non-Medical): Not on file  Physical Activity:   . Days of Exercise per Week: Not on file  . Minutes of Exercise per Session: Not on file  Stress:   . Feeling of Stress : Not on file  Social Connections:   . Frequency of Communication with Friends and Family: Not on file  . Frequency of Social Gatherings with Friends and Family: Not on file  . Attends Religious Services: Not on file  . Active Member of Clubs or Organizations: Not on file  . Attends Archivist Meetings: Not on file  . Marital Status: Not on file  Intimate Partner Violence:   . Fear of Current or Ex-Partner: Not on file  . Emotionally Abused: Not on file  . Physically Abused: Not on file  . Sexually Abused: Not on file      ROS:  General: Negative for anorexia, fever, chills, fatigue, weakness. See hpi Eyes: Negative for vision changes.  ENT: Negative for hoarseness, difficulty swallowing , nasal congestion. CV: Negative for chest pain, angina, palpitations, dyspnea on exertion, peripheral edema.  Respiratory: Negative for dyspnea at rest, dyspnea on exertion, cough, sputum, wheezing.  GI: See history of present illness. GU:  Negative for dysuria, hematuria, urinary incontinence, urinary frequency, nocturnal urination.  MS: Negative for joint pain, +low back pain.  Derm: Negative for rash or itching.  Neuro: Negative for weakness, abnormal sensation, seizure, frequent headaches, memory loss, confusion.  Psych: Negative for anxiety, depression, suicidal ideation, hallucinations.  Endo: Negative for unusual weight change.  Heme: Negative for bruising or bleeding. Allergy: Negative for rash or hives.    Physical Examination:  BP (!) 150/88   Pulse (!) 102   Temp (!) 96.9 F (36.1 C) (Temporal)   Ht 6\' 3"  (1.905 m)   Wt 220 lb 6.4 oz (100 kg)   BMI 27.55 kg/m    General: Well-nourished, well-developed in no acute distress.  Head: Normocephalic, atraumatic.    Eyes: Conjunctiva pink, no icterus. Mouth: Masked Neck: Supple    Lungs: Clear to auscultation bilaterally.  Heart: Regular rate and rhythm, no murmurs rubs or gallops.  Abdomen: Bowel sounds are normal, no hepatosplenomegaly or masses, no abdominal bruits or    hernia , no rebound or guarding.  Mild epigastric and LLQ tenderness Rectal: no masses in rectal vault. Secretions heme neg. nontender exam. Extremities: No lower extremity edema. No clubbing or deformities.  Neuro: Alert and oriented x 4 , grossly normal neurologically.  Skin: Warm and dry, no rash or jaundice.   Psych: Alert and cooperative, normal mood  and affect.  Labs: Lab Results  Component Value Date   CREATININE 0.59 (L) 10/19/2018   BUN 8 10/19/2018   NA 135 10/19/2018   K 3.6 10/19/2018   CL 102 10/19/2018   CO2 22 10/19/2018   Lab Results  Component Value Date   WBC 8.1 10/19/2018   HGB 16.5 10/19/2018   HCT 48.1 10/19/2018   MCV 90.6 10/19/2018   PLT 259 10/19/2018   Lab Results  Component Value Date   LIPASE 36 10/19/2018   Lab Results  Component Value Date   ALT 22 10/19/2018   AST 19 10/19/2018   ALKPHOS 83 10/19/2018   BILITOT 0.4 10/19/2018     Imaging Studies: No results found.

## 2019-07-01 NOTE — H&P (View-Only) (Signed)
Primary Care Physician:  Asencion Noble, MD  Primary Gastroenterologist:  Garfield Cornea, MD   Chief Complaint  Patient presents with  . Diarrhea    usually daily, yellow; has seen bright red blood  . Abdominal Pain    all over but mostly left side; feels gassy    HPI:  Shane Mayo is a 39 y.o. male here at the request of Dr. Willey Blade for further evaluation of diarrhea.  Patient started having diarrhea in last 03/2019. He has had similar episodes in the past but nothing that has lasted more than few days. He cannot recall last solid stool. Some nocturnal stools. Intermittent brbpr. Complains of postprandial urgency, and limits intake due to this. He has had some nausea. Currently having two loose stools daily. He complains of epigastric pain associated with heartburn, regurgitation which is all new in the past several months.  Some crampy abdominal pain with diarrhea.  Lots of abdominal noises, feels like he is full of gas.  No dietary or medication changes at time of onset of symptoms.  He has tried about 4 different medications for diarrhea.  He has been on Lomotil, dicyclomine without relief.  No antibiotics.  He does not remember the other 2 medications.  He was diagnosed with diabetes mellitus, type II in May 2020.  Initially placed on Metformin 1000 mg twice daily.  Recently this was reduced to 500 mg twice daily to see if it would help his diarrhea. No significant improvement.  He is now also on glipizide.  A1c worse with these changes, over 10 now.  Patient states he is lost down from 285 pounds since 2013.  Initially significant weight loss in order to cope with his back issues.  In the past year he is lost 25 pounds in the setting of undiagnosed diabetes.  Patient has been taking BC powders on occasion for headache.  Tries to limit.  No other NSAIDs.  Labs from 06/02/2019: White blood cell count 11,900, hemoglobin 17.4, platelets 308,000, glucose 214, creatinine 0.83, albumin 4.9, total  bilirubin 0.4, alkaline phosphatase 89, AST 15, ALT 29, CRP 4, sed rate 15.  GI profile negative.   Current Outpatient Medications  Medication Sig Dispense Refill  . glipiZIDE (GLUCOTROL) 5 MG tablet Take 1 tablet (5 mg total) by mouth daily before breakfast. 30 tablet 0  . metFORMIN (GLUCOPHAGE) 1000 MG tablet Take 500 mg by mouth 2 (two) times daily with a meal.     No current facility-administered medications for this visit.    Allergies as of 07/02/2019  . (No Known Allergies)    Past Medical History:  Diagnosis Date  . Diabetes ([place])   . Herniated cervical disc   . Kidney stone   . Renal disorder     Past Surgical History:  Procedure Laterality Date  . APPENDECTOMY  7  . BACK SURGERY  34  . KNEE ARTHROSCOPY  2017   ACL  . LITHOTRIPSY      Family History  Problem Relation Age of Onset  . Diabetes Father   . Colon cancer Neg Hx   . Celiac disease Neg Hx   . Inflammatory bowel disease Neg Hx     Social History   Socioeconomic History  . Marital status: Single    Spouse name: Not on file  . Number of children: Not on file  . Years of education: Not on file  . Highest education level: Not on file  Occupational History  . Not on file  Tobacco Use  . Smoking status: Current Every Day Smoker    Packs/day: 1.00    Types: Cigarettes  . Smokeless tobacco: Never Used  Substance and Sexual Activity  . Alcohol use: Not Currently    Comment: never heavy or daily drinking. every couple of months or so may have a drink  . Drug use: No  . Sexual activity: Not on file  Other Topics Concern  . Not on file  Social History Narrative  . Not on file   Social Determinants of Health   Financial Resource Strain:   . Difficulty of Paying Living Expenses: Not on file  Food Insecurity:   . Worried About Charity fundraiser in the Last Year: Not on file  . Ran Out of Food in the Last Year: Not on file  Transportation Needs:   . Lack of Transportation (Medical): Not on  file  . Lack of Transportation (Non-Medical): Not on file  Physical Activity:   . Days of Exercise per Week: Not on file  . Minutes of Exercise per Session: Not on file  Stress:   . Feeling of Stress : Not on file  Social Connections:   . Frequency of Communication with Friends and Family: Not on file  . Frequency of Social Gatherings with Friends and Family: Not on file  . Attends Religious Services: Not on file  . Active Member of Clubs or Organizations: Not on file  . Attends Archivist Meetings: Not on file  . Marital Status: Not on file  Intimate Partner Violence:   . Fear of Current or Ex-Partner: Not on file  . Emotionally Abused: Not on file  . Physically Abused: Not on file  . Sexually Abused: Not on file      ROS:  General: Negative for anorexia, fever, chills, fatigue, weakness. See hpi Eyes: Negative for vision changes.  ENT: Negative for hoarseness, difficulty swallowing , nasal congestion. CV: Negative for chest pain, angina, palpitations, dyspnea on exertion, peripheral edema.  Respiratory: Negative for dyspnea at rest, dyspnea on exertion, cough, sputum, wheezing.  GI: See history of present illness. GU:  Negative for dysuria, hematuria, urinary incontinence, urinary frequency, nocturnal urination.  MS: Negative for joint pain, +low back pain.  Derm: Negative for rash or itching.  Neuro: Negative for weakness, abnormal sensation, seizure, frequent headaches, memory loss, confusion.  Psych: Negative for anxiety, depression, suicidal ideation, hallucinations.  Endo: Negative for unusual weight change.  Heme: Negative for bruising or bleeding. Allergy: Negative for rash or hives.    Physical Examination:  BP (!) 150/88   Pulse (!) 102   Temp (!) 96.9 F (36.1 C) (Temporal)   Ht 6\' 3"  (1.905 m)   Wt 220 lb 6.4 oz (100 kg)   BMI 27.55 kg/m    General: Well-nourished, well-developed in no acute distress.  Head: Normocephalic, atraumatic.    Eyes: Conjunctiva pink, no icterus. Mouth: Masked Neck: Supple    Lungs: Clear to auscultation bilaterally.  Heart: Regular rate and rhythm, no murmurs rubs or gallops.  Abdomen: Bowel sounds are normal, no hepatosplenomegaly or masses, no abdominal bruits or    hernia , no rebound or guarding.  Mild epigastric and LLQ tenderness Rectal: no masses in rectal vault. Secretions heme neg. nontender exam. Extremities: No lower extremity edema. No clubbing or deformities.  Neuro: Alert and oriented x 4 , grossly normal neurologically.  Skin: Warm and dry, no rash or jaundice.   Psych: Alert and cooperative, normal mood  and affect.  Labs: Lab Results  Component Value Date   CREATININE 0.59 (L) 10/19/2018   BUN 8 10/19/2018   NA 135 10/19/2018   K 3.6 10/19/2018   CL 102 10/19/2018   CO2 22 10/19/2018   Lab Results  Component Value Date   WBC 8.1 10/19/2018   HGB 16.5 10/19/2018   HCT 48.1 10/19/2018   MCV 90.6 10/19/2018   PLT 259 10/19/2018   Lab Results  Component Value Date   LIPASE 36 10/19/2018   Lab Results  Component Value Date   ALT 22 10/19/2018   AST 19 10/19/2018   ALKPHOS 83 10/19/2018   BILITOT 0.4 10/19/2018     Imaging Studies: No results found.

## 2019-07-02 ENCOUNTER — Other Ambulatory Visit: Payer: Self-pay

## 2019-07-02 ENCOUNTER — Encounter: Payer: Self-pay | Admitting: Gastroenterology

## 2019-07-02 ENCOUNTER — Ambulatory Visit (INDEPENDENT_AMBULATORY_CARE_PROVIDER_SITE_OTHER): Payer: BC Managed Care – PPO | Admitting: Gastroenterology

## 2019-07-02 VITALS — BP 150/88 | HR 102 | Temp 96.9°F | Ht 75.0 in | Wt 220.4 lb

## 2019-07-02 DIAGNOSIS — R1013 Epigastric pain: Secondary | ICD-10-CM | POA: Insufficient documentation

## 2019-07-02 DIAGNOSIS — K625 Hemorrhage of anus and rectum: Secondary | ICD-10-CM | POA: Insufficient documentation

## 2019-07-02 DIAGNOSIS — K219 Gastro-esophageal reflux disease without esophagitis: Secondary | ICD-10-CM | POA: Insufficient documentation

## 2019-07-02 DIAGNOSIS — K529 Noninfective gastroenteritis and colitis, unspecified: Secondary | ICD-10-CM

## 2019-07-02 DIAGNOSIS — R109 Unspecified abdominal pain: Secondary | ICD-10-CM | POA: Insufficient documentation

## 2019-07-02 MED ORDER — SUPREP BOWEL PREP KIT 17.5-3.13-1.6 GM/177ML PO SOLN: 1.0000 | ORAL | 0 refills | Status: DC

## 2019-07-02 MED ORDER — PANTOPRAZOLE SODIUM 40 MG PO TBEC: 40.0000 mg | DELAYED_RELEASE_TABLET | Freq: Every day | ORAL | 1 refills | Status: DC

## 2019-07-02 NOTE — Patient Instructions (Signed)
1. Schedule your labs as soon as possible.  We will contact you with results as available. 2. Start pantoprazole 40 mg daily before breakfast for possible gastritis, GERD.  Limit aspirin powders as much as possible. 3. Colonoscopy as scheduled.  Please see separate instructions. 4. Please call if you have worsening symptoms or any new changes in the interim.

## 2019-07-05 NOTE — Assessment & Plan Note (Signed)
Recent onset epigastric pain associated with heartburn.  Minimal aspirin powder use.  Start pantoprazole 40 mg daily.  If symptoms do not respond to therapy he may require further evaluation in the near future.  It is unclear at this time if his symptoms are associated with the lower GI symptoms of diarrhea.

## 2019-07-05 NOTE — Assessment & Plan Note (Signed)
Very pleasant 39 year old gentleman diagnosed with diabetes mellitus type 2 in May 2020, presenting with persistent diarrhea for several months.  25 pound weight loss in the setting of newly diagnosed diabetes.  He has had some intermittent bright red blood per rectum.  Significant postprandial loose stool.  GI profile negative.  Inflammatory markers normal.  No anemia.  Would recommend ruling out celiac disease and check thyroid status.  Plan for colonoscopy in the near future to rule out inflammatory bowel disease, malignancy.  Plan for deep sedation.  I have discussed the risks, alternatives, benefits with regards to but not limited to the risk of reaction to medication, bleeding, infection, perforation and the patient is agreeable to proceed. Written consent to be obtained.

## 2019-07-06 ENCOUNTER — Other Ambulatory Visit: Payer: Self-pay

## 2019-07-06 NOTE — Patient Instructions (Signed)
Called [employer] (spoke to Kia), no order# is needed for TCS. She advised me to contact member services.  Called [employer] member services# listed on back of insurance card. Spoke to [place], no PA needed for [employer]CS. Ref# [id number] scarlet.

## 2019-07-08 DIAGNOSIS — R1013 Epigastric pain: Secondary | ICD-10-CM | POA: Diagnosis not present

## 2019-07-08 DIAGNOSIS — R109 Unspecified abdominal pain: Secondary | ICD-10-CM | POA: Diagnosis not present

## 2019-07-08 DIAGNOSIS — K219 Gastro-esophageal reflux disease without esophagitis: Secondary | ICD-10-CM | POA: Diagnosis not present

## 2019-07-08 DIAGNOSIS — K529 Noninfective gastroenteritis and colitis, unspecified: Secondary | ICD-10-CM | POA: Diagnosis not present

## 2019-07-10 LAB — TSH+FREE T4
Free T4: 1.47 ng/dL (ref 0.82–1.77)
TSH: 0.686 u[IU]/mL (ref 0.450–4.500)

## 2019-07-10 LAB — IGA: IgA/Immunoglobulin A, Serum: 310 mg/dL (ref 90–386)

## 2019-07-10 LAB — TISSUE TRANSGLUTAMINASE, IGA: Transglutaminase IgA: <2 U/mL (ref 0–3)

## 2019-07-13 NOTE — Patient Instructions (Signed)
KALU SHAMI  07/13/2019     @PREFPERIOPPHARMACY @   Your procedure is scheduled on  07/16/2019   Report to [facility] at  [place].  Call this number if you have problems the morning of surgery:     Remember:  Follow the diet and prep instructions given to you by Dr Roseanne Kaufman office.                       Take these medicines the morning of surgery with A SIP OF WATER  Pantoprazole.    Do not wear jewelry, make-up or nail polish.  Do not wear lotions, powders, or perfumes. Please wear deodorant and brush your teeth.  Do not shave 48 hours prior to surgery.  Men may shave face and neck.  Do not bring valuables to the hospital.  [facility] is not responsible for any belongings or valuables.  Contacts, dentures or bridgework may not be worn into surgery.  Leave your suitcase in the car.  After surgery it may be brought to your room.  For patients admitted to the hospital, discharge time will be determined by your treatment team.  Patients discharged the day of surgery will not be allowed to drive home.   Name and phone number of your driver:   family Special instructions:  None  Please read over the following fact sheets that you were given. Anesthesia Post-op Instructions and Care and Recovery After Surgery       Colonoscopy, Adult, Care After This sheet gives you information about how to care for yourself after your procedure. Your health care provider may also give you more specific instructions. If you have problems or questions, contact your health care provider. What can I expect after the procedure? After the procedure, it is common to have:  A small amount of blood in your stool for 24 hours after the procedure.  Some gas.  Mild cramping or bloating of your abdomen. Follow these instructions at home: Eating and drinking   Drink enough fluid to keep your urine pale yellow.  Follow instructions from your health care provider about eating or  drinking restrictions.  Resume your normal diet as instructed by your health care provider. Avoid heavy or fried foods that are hard to digest. Activity  Rest as told by your health care provider.  Avoid sitting for a long time without moving. Get up to take short walks every 1-2 hours. This is important to improve blood flow and breathing. Ask for help if you feel weak or unsteady.  Return to your normal activities as told by your health care provider. Ask your health care provider what activities are safe for you. Managing cramping and bloating   Try walking around when you have cramps or feel bloated.  Apply heat to your abdomen as told by your health care provider. Use the heat source that your health care provider recommends, such as a moist heat pack or a heating pad. ? Place a towel between your skin and the heat source. ? Leave the heat on for 20-30 minutes. ? Remove the heat if your skin turns bright red. This is especially important if you are unable to feel pain, heat, or cold. You may have a greater risk of getting burned. General instructions  For the first 24 hours after the procedure: ? Do not drive or use machinery. ? Do not sign important documents. ? Do  not drink alcohol. ? Do your regular daily activities at a slower pace than normal. ? Eat soft foods that are easy to digest.  Take over-the-counter and prescription medicines only as told by your health care provider.  Keep all follow-up visits as told by your health care provider. This is important. Contact a health care provider if:  You have blood in your stool 2-3 days after the procedure. Get help right away if you have:  More than a small spotting of blood in your stool.  Large blood clots in your stool.  Swelling of your abdomen.  Nausea or vomiting.  A fever.  Increasing pain in your abdomen that is not relieved with medicine. Summary  After the procedure, it is common to have a small amount  of blood in your stool. You may also have mild cramping and bloating of your abdomen.  For the first 24 hours after the procedure, do not drive or use machinery, sign important documents, or drink alcohol.  Get help right away if you have a lot of blood in your stool, nausea or vomiting, a fever, or increased pain in your abdomen. This information is not intended to replace advice given to you by your health care provider. Make sure you discuss any questions you have with your health care provider. Document Revised: 12/21/2018 Document Reviewed: 12/21/2018 Elsevier Patient Education  [place] After These instructions provide you with information about caring for yourself after your procedure. Your health care provider may also give you more specific instructions. Your treatment has been planned according to current medical practices, but problems sometimes occur. Call your health care provider if you have any problems or questions after your procedure. What can I expect after the procedure? After your procedure, you may:  Feel sleepy for several hours.  Feel clumsy and have poor balance for several hours.  Feel forgetful about what happened after the procedure.  Have poor judgment for several hours.  Feel nauseous or vomit.  Have a sore throat if you had a breathing tube during the procedure. Follow these instructions at home: For at least 24 hours after the procedure:      Have a responsible adult stay with you. It is important to have someone help care for you until you are awake and alert.  Rest as needed.  Do not: ? Participate in activities in which you could fall or become injured. ? Drive. ? Use heavy machinery. ? Drink alcohol. ? Take sleeping pills or medicines that cause drowsiness. ? Make important decisions or sign legal documents. ? Take care of children on your own. Eating and drinking  Follow the diet that is  recommended by your health care provider.  If you vomit, drink water, juice, or soup when you can drink without vomiting.  Make sure you have little or no nausea before eating solid foods. General instructions  Take over-the-counter and prescription medicines only as told by your health care provider.  If you have sleep apnea, surgery and certain medicines can increase your risk for breathing problems. Follow instructions from your health care provider about wearing your sleep device: ? Anytime you are sleeping, including during daytime naps. ? While taking prescription pain medicines, sleeping medicines, or medicines that make you drowsy.  If you smoke, do not smoke without supervision.  Keep all follow-up visits as told by your health care provider. This is important. Contact a health care provider if:  You keep feeling  nauseous or you keep vomiting.  You feel light-headed.  You develop a rash.  You have a fever. Get help right away if:  You have trouble breathing. Summary  For several hours after your procedure, you may feel sleepy and have poor judgment.  Have a responsible adult stay with you for at least 24 hours or until you are awake and alert. This information is not intended to replace advice given to you by your health care provider. Make sure you discuss any questions you have with your health care provider. Document Revised: 08/25/2017 Document Reviewed: 09/17/2015 Elsevier Patient Education  [place].

## 2019-07-14 ENCOUNTER — Telehealth: Payer: Self-pay | Admitting: Gastroenterology

## 2019-07-14 NOTE — Telephone Encounter (Signed)
Opened in error

## 2019-07-15 ENCOUNTER — Encounter (HOSPITAL_COMMUNITY): Payer: Self-pay

## 2019-07-15 ENCOUNTER — Other Ambulatory Visit (HOSPITAL_COMMUNITY)
Admission: RE | Admit: 2019-07-15 | Discharge: 2019-07-15 | Disposition: A | Discharge status: Home / Self Care | Payer: BC Managed Care – PPO | Source: Ambulatory Visit | Attending: Internal Medicine | Admitting: Internal Medicine

## 2019-07-15 ENCOUNTER — Other Ambulatory Visit: Payer: Self-pay

## 2019-07-15 ENCOUNTER — Encounter (HOSPITAL_COMMUNITY)
Admission: RE | Admit: 2019-07-15 | Discharge: 2019-07-15 | Disposition: A | Discharge status: Home / Self Care | Payer: BC Managed Care – PPO | Source: Ambulatory Visit | Attending: Internal Medicine | Admitting: Internal Medicine

## 2019-07-15 DIAGNOSIS — Z01812 Encounter for preprocedural laboratory examination: Secondary | ICD-10-CM | POA: Diagnosis not present

## 2019-07-15 DIAGNOSIS — Z20822 Contact with and (suspected) exposure to covid-19: Secondary | ICD-10-CM | POA: Diagnosis not present

## 2019-07-15 LAB — BASIC METABOLIC PANEL
Anion gap: 14 (ref 5–15)
BUN: 14 mg/dL (ref 6–20)
CO2: 26 mmol/L (ref 22–32)
Calcium: 9.7 mg/dL (ref 8.9–10.3)
Chloride: 97 mmol/L — ABNORMAL LOW (ref 98–111)
Creatinine, Ser: 0.8 mg/dL (ref 0.61–1.24)
GFR calc Af Amer: >60 mL/min (ref >60)
GFR calc non Af Amer: >60 mL/min (ref >60)
Glucose, Bld: 304 mg/dL — ABNORMAL HIGH (ref 70–99)
Potassium: 4.6 mmol/L (ref 3.5–5.1)
Sodium: 137 mmol/L (ref 135–145)

## 2019-07-15 LAB — CBC
HCT: 49.1 % (ref 39.0–52.0)
Hemoglobin: 16.6 g/dL (ref 13.0–17.0)
MCH: 30.5 pg (ref 26.0–34.0)
MCHC: 33.8 g/dL (ref 30.0–36.0)
MCV: 90.1 fL (ref 80.0–100.0)
Platelets: 289 10*3/uL (ref 150–400)
RBC: 5.45 MIL/uL (ref 4.22–5.81)
RDW: 12 % (ref 11.5–15.5)
WBC: 16.9 10*3/uL — ABNORMAL HIGH (ref 4.0–10.5)
nRBC: 0 % (ref 0.0–0.2)

## 2019-07-15 LAB — SARS CORONAVIRUS 2 (TAT 6-24 HRS): SARS Coronavirus 2: NEGATIVE

## 2019-07-16 ENCOUNTER — Ambulatory Visit (HOSPITAL_COMMUNITY): Payer: BC Managed Care – PPO | Admitting: Anesthesiology

## 2019-07-16 ENCOUNTER — Ambulatory Visit (HOSPITAL_COMMUNITY)
Admission: RE | Admit: 2019-07-16 | Discharge: 2019-07-16 | Disposition: A | Discharge status: Home / Self Care | Payer: BC Managed Care – PPO | Attending: Internal Medicine | Admitting: Internal Medicine

## 2019-07-16 ENCOUNTER — Encounter (HOSPITAL_COMMUNITY): Payer: Self-pay | Admitting: Internal Medicine

## 2019-07-16 ENCOUNTER — Encounter (HOSPITAL_COMMUNITY)
Admission: RE | Disposition: A | Discharge status: Home / Self Care | Payer: Self-pay | Source: Home / Self Care | Attending: Internal Medicine

## 2019-07-16 DIAGNOSIS — K641 Second degree hemorrhoids: Secondary | ICD-10-CM | POA: Diagnosis not present

## 2019-07-16 DIAGNOSIS — F17210 Nicotine dependence, cigarettes, uncomplicated: Secondary | ICD-10-CM | POA: Diagnosis not present

## 2019-07-16 DIAGNOSIS — K921 Melena: Secondary | ICD-10-CM | POA: Diagnosis not present

## 2019-07-16 DIAGNOSIS — K219 Gastro-esophageal reflux disease without esophagitis: Secondary | ICD-10-CM | POA: Diagnosis not present

## 2019-07-16 DIAGNOSIS — K635 Polyp of colon: Secondary | ICD-10-CM | POA: Diagnosis not present

## 2019-07-16 DIAGNOSIS — Z7984 Long term (current) use of oral hypoglycemic drugs: Secondary | ICD-10-CM | POA: Diagnosis not present

## 2019-07-16 DIAGNOSIS — K529 Noninfective gastroenteritis and colitis, unspecified: Secondary | ICD-10-CM | POA: Diagnosis not present

## 2019-07-16 DIAGNOSIS — K644 Residual hemorrhoidal skin tags: Secondary | ICD-10-CM | POA: Insufficient documentation

## 2019-07-16 DIAGNOSIS — E1165 Type 2 diabetes mellitus with hyperglycemia: Secondary | ICD-10-CM | POA: Diagnosis not present

## 2019-07-16 DIAGNOSIS — D125 Benign neoplasm of sigmoid colon: Secondary | ICD-10-CM | POA: Diagnosis not present

## 2019-07-16 HISTORY — PX: COLONOSCOPY WITH PROPOFOL: SHX5780

## 2019-07-16 HISTORY — PX: POLYPECTOMY: SHX5525

## 2019-07-16 LAB — GLUCOSE, CAPILLARY
Glucose-Capillary: 253 mg/dL — ABNORMAL HIGH (ref 70–99)
Glucose-Capillary: 238 mg/dL — ABNORMAL HIGH (ref 70–99)

## 2019-07-16 SURGERY — COLONOSCOPY WITH PROPOFOL
Anesthesia: General

## 2019-07-16 MED ORDER — LIDOCAINE HCL (CARDIAC) PF 100 MG/5ML IV SOSY
PREFILLED_SYRINGE | INTRAVENOUS | Status: DC | PRN
  Administered 2019-07-16: 60 mg via INTRATRACHEAL

## 2019-07-16 MED ORDER — KETAMINE HCL 10 MG/ML IJ SOLN: INTRAMUSCULAR | Status: DC | PRN

## 2019-07-16 MED ORDER — PROMETHAZINE HCL 25 MG/ML IJ SOLN: 6.2500 mg | INTRAMUSCULAR | Status: DC | PRN

## 2019-07-16 MED ORDER — MIDAZOLAM HCL 2 MG/2ML IJ SOLN: 0.5000 mg | Freq: Once | INTRAMUSCULAR | Status: DC | PRN

## 2019-07-16 MED ORDER — HYDROMORPHONE HCL 1 MG/ML IJ SOLN: 0.2500 mg | INTRAMUSCULAR | Status: DC | PRN

## 2019-07-16 MED ORDER — PROPOFOL 500 MG/50ML IV EMUL
INTRAVENOUS | Status: DC | PRN
  Administered 2019-07-16: 150 ug/kg/min via INTRAVENOUS

## 2019-07-16 MED ORDER — LACTATED RINGERS IV SOLN: INTRAVENOUS | Status: DC | PRN

## 2019-07-16 MED ORDER — KETAMINE HCL 10 MG/ML IJ SOLN
INTRAMUSCULAR | Status: DC | PRN
  Administered 2019-07-16: 10 mg via INTRAVENOUS
  Administered 2019-07-16: 20 mg via INTRAVENOUS

## 2019-07-16 MED ORDER — HYDROCODONE-ACETAMINOPHEN 7.5-325 MG PO TABS: 1.0000 | ORAL_TABLET | Freq: Once | ORAL | Status: DC | PRN

## 2019-07-16 MED ORDER — KETAMINE HCL 50 MG/5ML IJ SOSY
PREFILLED_SYRINGE | INTRAMUSCULAR | Status: AC
  Filled 2019-07-16: qty 5

## 2019-07-16 MED ORDER — PROPOFOL 10 MG/ML IV BOLUS
INTRAVENOUS | Status: DC | PRN
  Administered 2019-07-16 (×4): 20 mg via INTRAVENOUS

## 2019-07-16 MED ORDER — GLYCOPYRROLATE 0.2 MG/ML IJ SOLN
INTRAMUSCULAR | Status: DC | PRN
  Administered 2019-07-16: .2 mg via INTRAVENOUS

## 2019-07-16 MED ORDER — GLYCOPYRROLATE PF 0.2 MG/ML IJ SOSY
PREFILLED_SYRINGE | INTRAMUSCULAR | Status: AC
  Filled 2019-07-16: qty 1

## 2019-07-16 MED ORDER — CHLORHEXIDINE GLUCONATE CLOTH 2 % EX PADS: 6.0000 | MEDICATED_PAD | Freq: Once | CUTANEOUS | Status: DC

## 2019-07-16 MED ORDER — LACTATED RINGERS IV SOLN
INTRAVENOUS | Status: DC
  Administered 2019-07-16: 1000 mL via INTRAVENOUS

## 2019-07-16 MED ORDER — LIDOCAINE 2% (20 MG/ML) 5 ML SYRINGE
INTRAMUSCULAR | Status: AC
  Filled 2019-07-16: qty 10

## 2019-07-16 NOTE — Anesthesia Preprocedure Evaluation (Signed)
Anesthesia Evaluation  Patient identified by MRN, date of birth, ID band Patient awake    Reviewed: Allergy & Precautions, NPO status , Patient's Chart, lab work & pertinent test results  Airway Mallampati: I  TM Distance: >3 FB Neck ROM: Full    Dental no notable dental hx. (+) Teeth Intact   Pulmonary neg pulmonary ROS, Current Smoker and Patient abstained from smoking.,    Pulmonary exam normal breath sounds clear to auscultation       Cardiovascular Exercise Tolerance: Good negative cardio ROS Normal cardiovascular examI Rhythm:Regular Rate:Normal     Neuro/Psych negative neurological ROS  negative psych ROS   GI/Hepatic Neg liver ROS, GERD  Medicated and Controlled,  Endo/Other  negative endocrine ROSdiabetes, Poorly Controlled, Type 2, Oral Hypoglycemic Agents  Renal/GU negative Renal ROSH/o stones  negative genitourinary   Musculoskeletal negative musculoskeletal ROS (+)   Abdominal   Peds negative pediatric ROS (+)  Hematology negative hematology ROS (+)   Anesthesia Other Findings   Reproductive/Obstetrics negative OB ROS                             Anesthesia Physical Anesthesia Plan  ASA: II  Anesthesia Plan: General   Post-op Pain Management:    Induction: Intravenous  PONV Risk Score and Plan: 1 and TIVA and Propofol infusion  Airway Management Planned: Simple Face Mask and Nasal Cannula  Additional Equipment:   Intra-op Plan:   Post-operative Plan:   Informed Consent: I have reviewed the patients History and Physical, chart, labs and discussed the procedure including the risks, benefits and alternatives for the proposed anesthesia with the patient or authorized representative who has indicated his/her understanding and acceptance.     Dental advisory given  Plan Discussed with: CRNA  Anesthesia Plan Comments: (Plan Full PPE use  Plan GA with GETA as  needed d/w pt -WTP with same after Q&A)        Anesthesia Quick Evaluation

## 2019-07-16 NOTE — Transfer of Care (Signed)
Immediate Anesthesia Transfer of Care Note  Patient: Shane Mayo  Procedure(s) Performed: COLONOSCOPY WITH PROPOFOL (N/A ) POLYPECTOMY  Patient Location: PACU  Anesthesia Type:General  Level of Consciousness: awake  Airway & Oxygen Therapy: Patient Spontanous Breathing  Post-op Assessment: Report given to RN  Post vital signs: Reviewed  Last Vitals:  Vitals Value Taken Time  BP 132/93 07/16/19 1110  Temp    Pulse 113 07/16/19 1112  Resp 16 07/16/19 1112  SpO2 97 % 07/16/19 1112  Vitals shown include unvalidated device data.  Last Pain:  Vitals:   07/16/19 0912  TempSrc: Oral  PainSc: 0-No pain      Patients Stated Pain Goal: 7 ([id number])  Complications: No apparent anesthesia complications

## 2019-07-16 NOTE — Interval H&P Note (Signed)
History and Physical Interval Note:  07/16/2019 10:29 AM  Shane Mayo  has presented today for surgery, with the diagnosis of rectal bleeding, chronic diarrhea.  The various methods of treatment have been discussed with the patient and family. After consideration of risks, benefits and other options for treatment, the patient has consented to  Procedure(s) with comments: COLONOSCOPY WITH PROPOFOL (N/A) - 10:15am as a surgical intervention.  The patient's history has been reviewed, patient examined, no change in status, stable for surgery.  I have reviewed the patient's chart and labs.  Questions were answered to the patient's satisfaction.     Shane Mayo    No change in intermittent rectal bleeding and diarrhea.  Plan for colonoscopy only today per office evaluation.  The risks, benefits, limitations, alternatives and imponderables have been reviewed with the patient. Questions have been answered. All parties are agreeable.

## 2019-07-16 NOTE — Interval H&P Note (Signed)
History and Physical Interval Note:  07/16/2019 10:34 AM  Shane Mayo  has presented today for surgery, with the diagnosis of rectal bleeding, chronic diarrhea.  The various methods of treatment have been discussed with the patient and family. After consideration of risks, benefits and other options for treatment, the patient has consented to  Procedure(s) with comments: COLONOSCOPY WITH PROPOFOL (N/A) - 10:15am as a surgical intervention.  The patient's history has been reviewed, patient examined, no change in status, stable for surgery.  I have reviewed the patient's chart and labs.  Questions were answered to the patient's satisfaction.     Shane Mayo  No change.  Intermittent bloody stools diarrhea.  Colonoscopy only today per plan The risks, benefits, limitations, alternatives and imponderables have been reviewed with the patient. Questions have been answered. All parties are agreeable.

## 2019-07-16 NOTE — Op Note (Signed)
[name] Patient Name: Shane Mayo Procedure Date: 07/16/2019 10:29 AM MRN: [id number] Date of Birth: 1980/09/26 Attending MD: Norvel Richards , MD CSN: [id number] Age: 39 Admit Type: Outpatient Procedure:                Colonoscopy Indications:              Chronic diarrhea, Hematochezia Providers:                Norvel Richards, MD, Lurline Del, RN, Raphael Gibney, Technician Referring MD:              Medicines:                Propofol per Anesthesia Complications:            No immediate complications. Estimated Blood Loss:     Estimated blood loss was minimal. Procedure:                Pre-Anesthesia Assessment:                           - Prior to the procedure, a History and Physical                            was performed, and patient medications and                            allergies were reviewed. The patient's tolerance of                            previous anesthesia was also reviewed. The risks                            and benefits of the procedure and the sedation                            options and risks were discussed with the patient.                            All questions were answered, and informed consent                            was obtained. Prior Anticoagulants: The patient has                            taken no previous anticoagulant or antiplatelet                            agents. ASA Grade Assessment: II - A patient with                            mild systemic disease. After reviewing the risks  and benefits, the patient was deemed in                            satisfactory condition to undergo the procedure.                           After obtaining informed consent, the colonoscope                            was passed under direct vision. Throughout the                            procedure, the patient's blood pressure, pulse, and                            oxygen  saturations were monitored continuously. The                            [id number] scope was introduced through                            the anus and advanced to the 5 cm into the ileum.                            The colonoscopy was performed without difficulty.                            The patient tolerated the procedure well. The                            quality of the bowel preparation was adequate. The                            terminal ileum, ileocecal valve, appendiceal                            orifice, and rectum were photographed. Scope In: 10:44:21 AM Scope Out: 11:01:58 AM Scope Withdrawal Time: 0 hours 11 minutes 22 seconds  Total Procedure Duration: 0 hours 17 minutes 37 seconds  Findings:      The perianal and digital rectal examinations were normal.      Three sessile polyps were found in the sigmoid colon and cecum. The       polyps were 3 to 5 mm in size. These polyps were removed with a cold       snare. Resection and retrieval were complete. Estimated blood loss was       minimal.      Non-bleeding external and internal hemorrhoids were found during       endoscopy. The hemorrhoids were moderate, medium-sized and Grade II       (internal hemorrhoids that prolapse but reduce spontaneously).      The exam was otherwise without abnormality on direct and retroflexion       views. Impression:               - Three 3 to 5 mm polyps in  the sigmoid colon and                            in the cecum, removed with a cold snare. Resected                            and retrieved.                           - Non-bleeding external and internal hemorrhoids.                           - The examination was otherwise normal on direct                            and retroflexion views. I suspect trivial bleeding                            from hemorrhoids. I also suspect Metformin may be a                            major contributing factor to diarrhea. Moderate  Sedation:      Moderate (conscious) sedation was personally administered by an       anesthesia professional. The following parameters were monitored: oxygen       saturation, heart rate, blood pressure, respiratory rate, EKG, adequacy       of pulmonary ventilation, and response to care. Recommendation:           - Patient has a contact number available for                            emergencies. The signs and symptoms of potential                            delayed complications were discussed with the                            patient. Return to normal activities tomorrow.                            Written discharge instructions were provided to the                            patient.                           - Resume previous diet.                           - Continue present medications.                           - Repeat colonoscopy for surveillance based on                            pathology results. No up  on pathology.                           - Return to GI office after studies are complete.                            Office visit with [place] in 3 to 4 weeks. Would                            consider COMPLETE cessation of Metformin for a                            meaningful period i.e. 3 to 4 weeks to see if this                            has a positive impact on diarrhea. Procedure Code(s):        --- Professional ---                           [phone], Colonoscopy, flexible; with removal of                            tumor(s), polyp(s), or other lesion(s) by snare                            technique Diagnosis Code(s):        --- Professional ---                           K63.5, Polyp of colon                           K64.1, Second degree hemorrhoids                           K52.9, Noninfective gastroenteritis and colitis,                            unspecified                           K92.1, Melena (includes Hematochezia) CPT copyright 2019 American Medical Association.  All rights reserved. The codes documented in this report are preliminary and upon coder review may  be revised to meet current compliance requirements. [name]. [name], MD Norvel Richards, MD 07/16/2019 11:14:42 AM This report has been signed electronically. Number of Addenda: 0

## 2019-07-16 NOTE — Discharge Instructions (Signed)
Colonoscopy Discharge Instructions  Read the instructions outlined below and refer to this sheet in the next few weeks. These discharge instructions provide you with general information on caring for yourself after you leave the hospital. Your doctor may also give you specific instructions. While your treatment has been planned according to the most current medical practices available, unavoidable complications occasionally occur. If you have any problems or questions after discharge, call Dr. Gala Romney at [phone]. ACTIVITY  You may resume your regular activity, but move at a slower pace for the next 24 hours.   Take frequent rest periods for the next 24 hours.   Walking will help get rid of the air and reduce the bloated feeling in your belly (abdomen).   No driving for 24 hours (because of the medicine (anesthesia) used during the test).    Do not sign any important legal documents or operate any machinery for 24 hours (because of the anesthesia used during the test).  NUTRITION  Drink plenty of fluids.   You may resume your normal diet as instructed by your doctor.   Begin with a light meal and progress to your normal diet. Heavy or fried foods are harder to digest and may make you feel sick to your stomach (nauseated).   Avoid alcoholic beverages for 24 hours or as instructed.  MEDICATIONS  You may resume your normal medications unless your doctor tells you otherwise.  WHAT YOU CAN EXPECT TODAY  Some feelings of bloating in the abdomen.   Passage of more gas than usual.   Spotting of blood in your stool or on the toilet paper.  IF YOU HAD POLYPS REMOVED DURING THE COLONOSCOPY:  No aspirin products for 7 days or as instructed.   No alcohol for 7 days or as instructed.   Eat a soft diet for the next 24 hours.  FINDING OUT THE RESULTS OF YOUR TEST Not all test results are available during your visit. If your test results are not back during the visit, make an appointment  with your caregiver to find out the results. Do not assume everything is normal if you have not heard from your caregiver or the medical facility. It is important for you to follow up on all of your test results.  SEEK IMMEDIATE MEDICAL ATTENTION IF:  You have more than a spotting of blood in your stool.   Your belly is swollen (abdominal distention).   You are nauseated or vomiting.   You have a temperature over 101.   You have abdominal pain or discomfort that is severe or gets worse throughout the day.   Colon polyp information provided  Hemorrhoid information provided  Further recommendations to follow pending review of pathology report  Office visit with [place] in 3 to 4 weeks ([facility])  At patient request I spoke to Koleen Nimrod at [phone] results.    Colon Polyps  Polyps are tissue growths inside the body. Polyps can grow in many places, including the large intestine (colon). A polyp may be a round bump or a mushroom-shaped growth. You could have one polyp or several. Most colon polyps are noncancerous (benign). However, some colon polyps can become cancerous over time. Finding and removing the polyps early can help prevent this. What are the causes? The exact cause of colon polyps is not known. What increases the risk? You are more likely to develop this condition if you:  Have a family history of colon cancer or colon polyps.  Are older than 50  or older than 45 if you are African American.  Have inflammatory bowel disease, such as ulcerative colitis or Crohn's disease.  Have certain hereditary conditions, such as: ? Familial adenomatous polyposis. ? Lynch syndrome. ? Turcot syndrome. ? Peutz-Jeghers syndrome.  Are overweight.  Smoke cigarettes.  Do not get enough exercise.  Drink too much alcohol.  Eat a diet that is high in fat and red meat and low in fiber.  Had childhood cancer that was treated with abdominal radiation. What are the signs  or symptoms? Most polyps do not cause symptoms. If you have symptoms, they may include:  Blood coming from your rectum when having a bowel movement.  Blood in your stool. The stool may look dark red or black.  Abdominal pain.  A change in bowel habits, such as constipation or diarrhea. How is this diagnosed? This condition is diagnosed with a colonoscopy. This is a procedure in which a lighted, flexible scope is inserted into the anus and then passed into the colon to examine the area. Polyps are sometimes found when a colonoscopy is done as part of routine cancer screening tests. How is this treated? Treatment for this condition involves removing any polyps that are found. Most polyps can be removed during a colonoscopy. Those polyps will then be tested for cancer. Additional treatment may be needed depending on the results of testing. Follow these instructions at home: Lifestyle  Maintain a healthy weight, or lose weight if recommended by your health care provider.  Exercise every day or as told by your health care provider.  Do not use any products that contain nicotine or tobacco, such as cigarettes and e-cigarettes. If you need help quitting, ask your health care provider.  If you drink alcohol, limit how much you have: ? 0-1 drink a day for women. ? 0-2 drinks a day for men.  Be aware of how much alcohol is in your drink. In the [place] one drink equals one 12 oz bottle of beer (355 mL), one 5 oz glass of wine (148 mL), or one 1 oz shot of hard liquor (44 mL). Eating and drinking   Eat foods that are high in fiber, such as fruits, vegetables, and whole grains.  Eat foods that are high in calcium and vitamin D, such as milk, cheese, yogurt, eggs, liver, fish, and broccoli.  Limit foods that are high in fat, such as fried foods and desserts.  Limit the amount of red meat and processed meat you eat, such as hot dogs, sausage, bacon, and lunch meats. General  instructions  Keep all follow-up visits as told by your health care provider. This is important. ? This includes having regularly scheduled colonoscopies. ? Talk to your health care provider about when you need a colonoscopy. Contact a health care provider if:  You have new or worsening bleeding during a bowel movement.  You have new or increased blood in your stool.  You have a change in bowel habits.  You lose weight for no known reason. Summary  Polyps are tissue growths inside the body. Polyps can grow in many places, including the colon.  Most colon polyps are noncancerous (benign), but some can become cancerous over time.  This condition is diagnosed with a colonoscopy.  Treatment for this condition involves removing any polyps that are found. Most polyps can be removed during a colonoscopy. This information is not intended to replace advice given to you by your health care provider. Make sure you discuss any questions  you have with your health care provider. Document Revised: 09/11/2017 Document Reviewed: 09/11/2017 Elsevier Patient Education  [place].    Hemorrhoids Hemorrhoids are swollen veins in and around the rectum or anus. There are two types of hemorrhoids: Internal hemorrhoids. These occur in the veins that are just inside the rectum. They may poke through to the outside and become irritated and painful. External hemorrhoids. These occur in the veins that are outside the anus and can be felt as a painful swelling or hard lump near the anus. Most hemorrhoids do not cause serious problems, and they can be managed with home treatments such as diet and lifestyle changes. If home treatments do not help the symptoms, procedures can be done to shrink or remove the hemorrhoids. What are the causes? This condition is caused by increased pressure in the anal area. This pressure may result from various things, including: Constipation. Straining to have a bowel  movement. Diarrhea. Pregnancy. Obesity. Sitting for long periods of time. Heavy lifting or other activity that causes you to strain. Anal sex. Riding a bike for a long period of time. What are the signs or symptoms? Symptoms of this condition include: Pain. Anal itching or irritation. Rectal bleeding. Leakage of stool (feces). Anal swelling. One or more lumps around the anus. How is this diagnosed? This condition can often be diagnosed through a visual exam. Other exams or tests may also be done, such as: An exam that involves feeling the rectal area with a gloved hand (digital rectal exam). An exam of the anal canal that is done using a small tube (anoscope). A blood test, if you have lost a significant amount of blood. A test to look inside the colon using a flexible tube with a camera on the end (sigmoidoscopy or colonoscopy). How is this treated? This condition can usually be treated at home. However, various procedures may be done if dietary changes, lifestyle changes, and other home treatments do not help your symptoms. These procedures can help make the hemorrhoids smaller or remove them completely. Some of these procedures involve surgery, and others do not. Common procedures include: Rubber band ligation. Rubber bands are placed at the base of the hemorrhoids to cut off their blood supply. Sclerotherapy. Medicine is injected into the hemorrhoids to shrink them. Infrared coagulation. A type of light energy is used to get rid of the hemorrhoids. Hemorrhoidectomy surgery. The hemorrhoids are surgically removed, and the veins that supply them are tied off. Stapled hemorrhoidopexy surgery. The surgeon staples the base of the hemorrhoid to the rectal wall. Follow these instructions at home: Eating and drinking  Eat foods that have a lot of fiber in them, such as whole grains, beans, nuts, fruits, and vegetables. Ask your health care provider about taking products that have added  fiber (fiber supplements). Reduce the amount of fat in your diet. You can do this by eating low-fat dairy products, eating less red meat, and avoiding processed foods. Drink enough fluid to keep your urine pale yellow. Managing pain and swelling  Take warm sitz baths for 20 minutes, 3-4 times a day to ease pain and discomfort. You may do this in a bathtub or using a portable sitz bath that fits over the toilet. If directed, apply ice to the affected area. Using ice packs between sitz baths may be helpful. Put ice in a plastic bag. Place a towel between your skin and the bag. Leave the ice on for 20 minutes, 2-3 times a day. General  instructions Take over-the-counter and prescription medicines only as told by your health care provider. Use medicated creams or suppositories as told. Get regular exercise. Ask your health care provider how much and what kind of exercise is best for you. In general, you should do moderate exercise for at least 30 minutes on most days of the week (150 minutes each week). This can include activities such as walking, biking, or yoga. Go to the bathroom when you have the urge to have a bowel movement. Do not wait. Avoid straining to have bowel movements. Keep the anal area dry and clean. Use wet toilet paper or moist towelettes after a bowel movement. Do not sit on the toilet for long periods of time. This increases blood pooling and pain. Keep all follow-up visits as told by your health care provider. This is important. Contact a health care provider if you have: Increasing pain and swelling that are not controlled by treatment or medicine. Difficulty having a bowel movement, or you are unable to have a bowel movement. Pain or inflammation outside the area of the hemorrhoids. Get help right away if you have: Uncontrolled bleeding from your rectum. Summary Hemorrhoids are swollen veins in and around the rectum or anus. Most hemorrhoids can be managed with home  treatments such as diet and lifestyle changes. Taking warm sitz baths can help ease pain and discomfort. In severe cases, procedures or surgery can be done to shrink or remove the hemorrhoids. This information is not intended to replace advice given to you by your health care provider. Make sure you discuss any questions you have with your health care provider. Document Revised: 10/23/2018 Document Reviewed: 10/16/2017 Elsevier Patient Education  [place] After These instructions provide you with information about caring for yourself after your procedure. Your health care provider may also give you more specific instructions. Your treatment has been planned according to current medical practices, but problems sometimes occur. Call your health care provider if you have any problems or questions after your procedure. What can I expect after the procedure? After your procedure, you may: Feel sleepy for several hours. Feel clumsy and have poor balance for several hours. Feel forgetful about what happened after the procedure. Have poor judgment for several hours. Feel nauseous or vomit. Have a sore throat if you had a breathing tube during the procedure. Follow these instructions at home: For at least 24 hours after the procedure:     Have a responsible adult stay with you. It is important to have someone help care for you until you are awake and alert. Rest as needed. Do not: Participate in activities in which you could fall or become injured. Drive. Use heavy machinery. Drink alcohol. Take sleeping pills or medicines that cause drowsiness. Make important decisions or sign legal documents. Take care of children on your own. Eating and drinking Follow the diet that is recommended by your health care provider. If you vomit, drink water, juice, or soup when you can drink without vomiting. Make sure you have little or no nausea before  eating solid foods. General instructions Take over-the-counter and prescription medicines only as told by your health care provider. If you have sleep apnea, surgery and certain medicines can increase your risk for breathing problems. Follow instructions from your health care provider about wearing your sleep device: Anytime you are sleeping, including during daytime naps. While taking prescription pain medicines, sleeping medicines, or medicines that make you  drowsy. If you smoke, do not smoke without supervision. Keep all follow-up visits as told by your health care provider. This is important. Contact a health care provider if: You keep feeling nauseous or you keep vomiting. You feel light-headed. You develop a rash. You have a fever. Get help right away if: You have trouble breathing. Summary For several hours after your procedure, you may feel sleepy and have poor judgment. Have a responsible adult stay with you for at least 24 hours or until you are awake and alert. This information is not intended to replace advice given to you by your health care provider. Make sure you discuss any questions you have with your health care provider. Document Revised: 08/25/2017 Document Reviewed: 09/17/2015 Elsevier Patient Education  [place].

## 2019-07-16 NOTE — Anesthesia Postprocedure Evaluation (Signed)
Anesthesia Post Note  Patient: Shane Mayo  Procedure(s) Performed: COLONOSCOPY WITH PROPOFOL (N/A ) POLYPECTOMY  Patient location during evaluation: PACU Anesthesia Type: General Level of consciousness: awake and alert and oriented Pain management: pain level controlled Vital Signs Assessment: post-procedure vital signs reviewed and stable Respiratory status: spontaneous breathing Cardiovascular status: blood pressure returned to baseline Postop Assessment: no apparent nausea or vomiting Anesthetic complications: no     Last Vitals:  Vitals:   07/16/19 0912  BP: 125/84  Pulse: 93  Resp: 20  Temp: 37 C  SpO2: 97%    Last Pain:  Vitals:   07/16/19 0912  TempSrc: Oral  PainSc: 0-No pain                 Parminder Cupples

## 2019-07-19 ENCOUNTER — Encounter: Payer: Self-pay | Admitting: Internal Medicine

## 2019-07-19 LAB — SURGICAL PATHOLOGY

## 2019-07-26 ENCOUNTER — Other Ambulatory Visit: Payer: Self-pay | Admitting: Gastroenterology

## 2019-08-04 ENCOUNTER — Ambulatory Visit: Payer: BC Managed Care – PPO | Admitting: Gastroenterology

## 2019-08-10 ENCOUNTER — Encounter: Payer: Self-pay | Admitting: Nurse Practitioner

## 2019-08-10 ENCOUNTER — Ambulatory Visit (INDEPENDENT_AMBULATORY_CARE_PROVIDER_SITE_OTHER): Payer: BLUE CROSS/BLUE SHIELD | Admitting: Nurse Practitioner

## 2019-08-10 ENCOUNTER — Encounter: Payer: Self-pay | Admitting: *Deleted

## 2019-08-10 ENCOUNTER — Other Ambulatory Visit: Payer: Self-pay

## 2019-08-10 VITALS — BP 138/90 | HR 93 | Temp 97.3°F | Ht 75.0 in | Wt 222.2 lb

## 2019-08-10 DIAGNOSIS — R1319 Other dysphagia: Secondary | ICD-10-CM

## 2019-08-10 DIAGNOSIS — K529 Noninfective gastroenteritis and colitis, unspecified: Secondary | ICD-10-CM

## 2019-08-10 DIAGNOSIS — K649 Unspecified hemorrhoids: Secondary | ICD-10-CM | POA: Insufficient documentation

## 2019-08-10 DIAGNOSIS — K219 Gastro-esophageal reflux disease without esophagitis: Secondary | ICD-10-CM

## 2019-08-10 DIAGNOSIS — R1310 Dysphagia, unspecified: Secondary | ICD-10-CM | POA: Insufficient documentation

## 2019-08-10 MED ORDER — HYDROCORTISONE (PERIANAL) 2.5 % EX CREA: 1.0000 "application " | TOPICAL_CREAM | Freq: Two times a day (BID) | CUTANEOUS | 1 refills | Status: DC | PRN

## 2019-08-10 MED ORDER — PANTOPRAZOLE SODIUM 40 MG PO TBEC: 40.0000 mg | DELAYED_RELEASE_TABLET | Freq: Two times a day (BID) | ORAL | 1 refills | Status: DC

## 2019-08-10 NOTE — Assessment & Plan Note (Signed)
Persistent GERD symptoms, ran out of PPI.  Was having still some intermittent GERD despite Protonix daily.  I will refill his Protonix and increase this to twice daily.  Request he call us if no improvement on PPI.  We are also planning to schedule an EGD for dysphagia which will help evaluate for any concerning etiologies related to his GERD.  Follow-up in 4 months in our office regardless.

## 2019-08-10 NOTE — Progress Notes (Signed)
Referring Provider: Asencion Noble, MD Primary Care Physician:  Asencion Noble, MD Primary GI:  Dr. Gala Romney  Chief Complaint  Patient presents with  . Hemorrhoids    some RB, using prep H  . Abdominal Pain    upper abd pain  . Nausea    "some"    HPI:   Shane Mayo is a 39 y.o. male who presents for post procedure follow-up on hemorrhoids.  The patient was last seen in our office 07/02/2019 for chronic diarrhea, epigastric pain, GERD, rectal bleeding.  This is a new patient evaluation at that time.  Diarrhea began in October 2020 with some previous/remote isolated episodes as well.  No solid stool essentially since October.  Noted postprandial urgency with self limitation of intake due to this.  Associated nausea.  Generally 2 loose stools daily.  Epigastric pain associated with heartburn and regurgitation which is also new.  Some crampy abdominal pain with his diarrhea, feels full of gas.  No recent medication changes, has tried 4 different medications for diarrhea including Lomotil, dicyclomine without relief.  No recent antibiotics.  Recent diagnosis of diabetes type 2 in May 2020 initially on Metformin 1000 mg twice a day that was reduced to 500 mg query improvement in diarrhea, but no improvement resulted.  Now on glipizide as well with hemoglobin A1c last documented over 10.  25 pound weight loss in the past year in the setting of undiagnosed diabetes.  Aspirin powders on occasion for headache no other NSAIDs.  Mild leukocytosis in December 2020 with a white blood cell count of 11.9, creatinine 0.83, LFTs normal.  GI profile negative.  Recommended updated labs, start Protonix 40 mg daily, limit NSAIDs, colonoscopy.  Labs completed 07/08/2019 found normal celiac panel, normal thyroid function.  Colonoscopy completed 07/16/2019 which found 3 sessile polyps from 3 to 5 mm in size, nonbleeding external and internal hemorrhoids that were described as moderate, medium-sized, and grade 2.  Overall  felt trivial bleeding from hemorrhoids.  Query Metformin contribution to diarrhea.  Today he states he's doing ok overall. Has some increased epigastric pain and noted solid food odynophagia, occasional dysphagia. Occasional regurgitation. Still with GERD symptoms, but ran out of his PPI a few days ago. Dysphagia/odynophagia symptoms were occurring while he was on PPI. Denies other abdominal pain, vomiting, melena. Still with some scant hemorrhoid toilet tissue hematochezia, taking preparation H which is not helping much. Bowel movements seems to be solidifying, diarrhea generally about once a week. Still on Metformin. Denies fever, chills, unintentional weight loss. Denies URI or flu-like symptoms. Denies loss of sense of taste or smell. Denies chest pain, dyspnea, dizziness, lightheadedness, syncope, near syncope. Denies any other upper or lower GI symptoms.  Objectively weight has been stable since the last visit.  Past Medical History:  Diagnosis Date  . Diabetes ([place])   . Herniated cervical disc   . Kidney stone   . Renal disorder     Past Surgical History:  Procedure Laterality Date  . APPENDECTOMY  7  . BACK SURGERY  34  . COLONOSCOPY WITH PROPOFOL N/A 07/16/2019   Procedure: COLONOSCOPY WITH PROPOFOL;  Surgeon: Daneil Dolin, MD;  Location: [facility];  Service: Endoscopy;  Laterality: N/A;  10:15am  . KNEE ARTHROSCOPY  2017   ACL  . LITHOTRIPSY    . POLYPECTOMY  07/16/2019   Procedure: POLYPECTOMY;  Surgeon: Daneil Dolin, MD;  Location: [facility];  Service: Endoscopy;;  cecal; sigmoidx2  Current Outpatient Medications  Medication Sig Dispense Refill  . glipiZIDE (GLUCOTROL) 5 MG tablet Take 1 tablet (5 mg total) by mouth daily before breakfast. 30 tablet 0  . hydrocortisone cream 1 % Apply 1 application topically as needed for itching.    Marland Kitchen ibuprofen (ADVIL) 200 MG tablet Take 400 mg by mouth every 8 (eight) hours as needed (for pain.).    Marland Kitchen metFORMIN (GLUCOPHAGE)  1000 MG tablet Take 500 mg by mouth 2 (two) times daily with a meal.    . Multiple Vitamin (MULTIVITAMIN WITH MINERALS) TABS tablet Take 1 tablet by mouth daily. Alive for Men Multivitamin    . pantoprazole (PROTONIX) 40 MG tablet TAKE 1 TABLET BY MOUTH DAILY BEFORE BREAKFAST (Patient not taking: Reported on 08/10/2019) 30 tablet 11   No current facility-administered medications for this visit.    Allergies as of 08/10/2019  . (No Known Allergies)    Family History  Problem Relation Age of Onset  . Diabetes Father   . Colon cancer Neg Hx   . Celiac disease Neg Hx   . Inflammatory bowel disease Neg Hx     Social History   Socioeconomic History  . Marital status: Single    Spouse name: Not on file  . Number of children: Not on file  . Years of education: Not on file  . Highest education level: Not on file  Occupational History  . Not on file  Tobacco Use  . Smoking status: Current Every Day Smoker    Packs/day: 1.00    Types: Cigarettes  . Smokeless tobacco: Never Used  Substance and Sexual Activity  . Alcohol use: Not Currently    Comment: never heavy or daily drinking. every couple of months or so may have a drink  . Drug use: No  . Sexual activity: Not on file  Other Topics Concern  . Not on file  Social History Narrative  . Not on file   Social Determinants of Health   Financial Resource Strain:   . Difficulty of Paying Living Expenses: Not on file  Food Insecurity:   . Worried About Charity fundraiser in the Last Year: Not on file  . Ran Out of Food in the Last Year: Not on file  Transportation Needs:   . Lack of Transportation (Medical): Not on file  . Lack of Transportation (Non-Medical): Not on file  Physical Activity:   . Days of Exercise per Week: Not on file  . Minutes of Exercise per Session: Not on file  Stress:   . Feeling of Stress : Not on file  Social Connections:   . Frequency of Communication with Friends and Family: Not on file  .  Frequency of Social Gatherings with Friends and Family: Not on file  . Attends Religious Services: Not on file  . Active Member of Clubs or Organizations: Not on file  . Attends Archivist Meetings: Not on file  . Marital Status: Not on file    Review of Systems: General: Negative for anorexia, weight loss, fever, chills, fatigue, weakness. ENT: Negative for hoarseness, difficulty swallowing. CV: Negative for chest pain, angina, palpitations, peripheral edema.  Respiratory: Negative for dyspnea at rest, cough, sputum, wheezing.  GI: See history of present illness. Endo: Negative for unusual weight change.  Heme: Negative for bruising or bleeding. Allergy: Negative for rash or hives.   Physical Exam: BP 138/90   Pulse 93   Temp (!) 97.3 F (36.3 C) (Oral)  Ht 6\' 3"  (1.905 m)   Wt 222 lb 3.2 oz (100.8 kg)   BMI 27.77 kg/m  General:   Alert and oriented. Pleasant and cooperative. Well-nourished and well-developed.  Eyes:  Without icterus, sclera clear and conjunctiva pink.  Ears:  Normal auditory acuity. Cardiovascular:  S1, S2 present without murmurs appreciated. Extremities without clubbing or edema. Respiratory:  Clear to auscultation bilaterally. No wheezes, rales, or rhonchi. No distress.  Gastrointestinal:  +BS, soft, non-tender and non-distended. No HSM noted. No guarding or rebound. No masses appreciated.  Rectal:  Deferred  Neurologic:  Alert and oriented x4;  grossly normal neurologically. Psych:  Alert and cooperative. Normal mood and affect. Heme/Lymph/Immune: No excessive bruising noted.    08/10/2019 8:53 AM   Disclaimer: This note was dictated with voice recognition software. Similar sounding words can inadvertently be transcribed and may not be corrected upon review.

## 2019-08-10 NOTE — Assessment & Plan Note (Signed)
Chronic diarrhea that seems to have improved recently after his colonoscopy, as per HPI.  Metformin is likely a contributing factor.  Recommend he trial Imodium, which she has not tried before.  We can also try to revisit other medications such as Lomotil, dicyclomine, hyoscyamine now his symptoms have improved somewhat.  Call us if no improvement with Imodium and we can try other medications.  Otherwise follow-up in 4 months.

## 2019-08-10 NOTE — Assessment & Plan Note (Signed)
Hemorrhoids documented both internal and external on his recent colonoscopy deemed likely source of trivial GI bleed.  He is still having some occasional intermittent bleeding, failed Preparation H therapy.  I will send in Anusol rectal cream.  Request call back if no improvement and we can consider internal hemorrhoid banding to see if this makes a difference.  Follow-up in 4 months regardless.

## 2019-08-10 NOTE — Assessment & Plan Note (Signed)
Noted intermittent but persistent solid food dysphagia associated with odynophagia.  Chronic history of GERD likely contributing.  Possible etiologies of her versus gastritis, GERD associated stricture, web, ring.  Cannot rule out more insidious pathology such as esophageal cancer, although this is low on the differential list.  We will plan for an upper endoscopy with possible dilation to further evaluate his GERD and to help his dysphagia symptoms.  Call for any worsening or severe symptoms.  Follow-up in 4 months.  Proceed with EGD on propofol/MAC with Dr. Gala Romney in near future: the risks, benefits, and alternatives have been discussed with the patient in detail. The patient states understanding and desires to proceed.  The patient is currently on glipizide and Metformin.  No other anticoagulants, anxiolytics, chronic pain medications, antidepressants, antidiabetics, or iron supplements.  We will plan for him to hold his glipizide and Metformin the morning of his procedure.  We will plan for the procedure on propofol/MAC given his age and anxiety, as was done for his last procedure.

## 2019-08-10 NOTE — Patient Instructions (Signed)
Your health issues we discussed today were:   Hemorrhoids with rectal bleeding: 1. I have sent in Anusol rectal cream to your pharmacy 2. You can use this up to twice a day for up to 10 days at a time for rectal bleeding, hemorrhoid symptoms 3. Call us if you do not have any improvement 4. Call us if you notice any significant bleeding  GERD (reflux/heartburn) with dysphagia and odynophagia (swallowing difficulties and painful swallowing): 1. I have refilled your Protonix and changed the dose to 40 mg twice a day. 2. Take this pursing in the morning when you wake up before eating, and 30 minutes for your last meal the day 3. Let us know if you continue to have worsening upper abdominal pain or GERD/reflux 4. We have scheduled an upper endoscopy to further evaluate as well as possibly dilate your esophagus to help with your swallowing difficulties 5. Further recommendations will follow  Diarrhea: 1. As we discussed try taking Imodium over-the-counter.  You can follow the directions on the bottle 2. Call us if you do not have any improvement in your diarrhea or if it gets worse 3. Metformin is likely contributing to your diarrhea as it is quite well known to cause loose stools 4. Call us for any worsening or severe symptoms  Overall I recommend:  1. Continue your other current medications 2. Return for follow-up in 4 months 3. Call us if you have any questions or concerns   ---------------------------------------------------------------  COVID-19 Vaccine Information can be found at: [email or web address].uk For questions related to vaccine distribution or appointments, please email [email or web address] or call [phone].   ---------------------------------------------------------------   At [facility] we value your feedback. You may receive a survey about your visit today. Please share your experience as we  strive to create trusting relationships with our patients to provide genuine, compassionate, quality care.  We appreciate your understanding and patience as we review any laboratory studies, imaging, and other diagnostic tests that are ordered as we care for you. Our office policy is 5 business days for review of these results, and any emergent or urgent results are addressed in a timely manner for your best interest. If you do not hear from our office in 1 week, please contact us.   We also encourage the use of MyChart, which contains your medical information for your review as well. If you are not enrolled in this feature, an access code is on this after visit summary for your convenience. Thank you for allowing [place] to be involved in your care.  It was great to see you today!  I hope you have a great day!!

## 2019-08-17 ENCOUNTER — Telehealth: Payer: Self-pay

## 2019-08-17 NOTE — Telephone Encounter (Signed)
Per chart, pt doesn't currently have insurance coverage. [employer] expired 08/09/19.

## 2019-09-14 ENCOUNTER — Encounter: Payer: Self-pay | Admitting: Internal Medicine

## 2019-10-04 ENCOUNTER — Telehealth: Payer: Self-pay | Admitting: *Deleted

## 2019-10-04 NOTE — Patient Instructions (Signed)
Shane Mayo  10/04/2019     @PREFPERIOPPHARMACY @   Your procedure is scheduled on  10/07/2019 .  Report to [facility] at  1200  P.M.  Call this number if you have problems the morning of surgery:  [phone]   Remember:  Follow the diet instructions given to you by Dr Roseanne Kaufman office.                     Take these medicines the morning of surgery with A SIP OF WATER  Protonix. DO NOT take any medications for diabetes the morning of your procedure.    Do not wear jewelry, make-up or nail polish.  Do not wear lotions, powders, or perfumes. Please wear deodorant and brush your teeth.  Do not shave 48 hours prior to surgery.  Men may shave face and neck.  Do not bring valuables to the hospital.  [facility] is not responsible for any belongings or valuables.  Contacts, dentures or bridgework may not be worn into surgery.  Leave your suitcase in the car.  After surgery it may be brought to your room.  For patients admitted to the hospital, discharge time will be determined by your treatment team.  Patients discharged the day of surgery will not be allowed to drive home.   Name and phone number of your driver:   family Special instructions:  DO NOT smoke the morning of your procedure.  Please read over the following fact sheets that you were given. Anesthesia Post-op Instructions and Care and Recovery After Surgery       Upper Endoscopy, Adult, Care After This sheet gives you information about how to care for yourself after your procedure. Your health care provider may also give you more specific instructions. If you have problems or questions, contact your health care provider. What can I expect after the procedure? After the procedure, it is common to have:  A sore throat.  Mild stomach pain or discomfort.  Bloating.  Nausea. Follow these instructions at home:   Follow instructions from your health care provider about what to eat or drink after your  procedure.  Return to your normal activities as told by your health care provider. Ask your health care provider what activities are safe for you.  Take over-the-counter and prescription medicines only as told by your health care provider.  Do not drive for 24 hours if you were given a sedative during your procedure.  Keep all follow-up visits as told by your health care provider. This is important. Contact a health care provider if you have:  A sore throat that lasts longer than one day.  Trouble swallowing. Get help right away if:  You vomit blood or your vomit looks like coffee grounds.  You have: ? A fever. ? Bloody, black, or tarry stools. ? A severe sore throat or you cannot swallow. ? Difficulty breathing. ? Severe pain in your chest or abdomen. Summary  After the procedure, it is common to have a sore throat, mild stomach discomfort, bloating, and nausea.  Do not drive for 24 hours if you were given a sedative during the procedure.  Follow instructions from your health care provider about what to eat or drink after your procedure.  Return to your normal activities as told by your health care provider. This information is not intended to replace advice given to you by your health care provider. Make sure you discuss any questions you  have with your health care provider. Document Revised: 11/18/2017 Document Reviewed: 10/27/2017 Elsevier Patient Education  2020 [employer] After These instructions provide you with information about caring for yourself after your procedure. Your health care provider may also give you more specific instructions. Your treatment has been planned according to current medical practices, but problems sometimes occur. Call your health care provider if you have any problems or questions after your procedure. What can I expect after the procedure? After your procedure, you may:  Feel sleepy for several  hours.  Feel clumsy and have poor balance for several hours.  Feel forgetful about what happened after the procedure.  Have poor judgment for several hours.  Feel nauseous or vomit.  Have a sore throat if you had a breathing tube during the procedure. Follow these instructions at home: For at least 24 hours after the procedure:      Have a responsible adult stay with you. It is important to have someone help care for you until you are awake and alert.  Rest as needed.  Do not: ? Participate in activities in which you could fall or become injured. ? Drive. ? Use heavy machinery. ? Drink alcohol. ? Take sleeping pills or medicines that cause drowsiness. ? Make important decisions or sign legal documents. ? Take care of children on your own. Eating and drinking  Follow the diet that is recommended by your health care provider.  If you vomit, drink water, juice, or soup when you can drink without vomiting.  Make sure you have little or no nausea before eating solid foods. General instructions  Take over-the-counter and prescription medicines only as told by your health care provider.  If you have sleep apnea, surgery and certain medicines can increase your risk for breathing problems. Follow instructions from your health care provider about wearing your sleep device: ? Anytime you are sleeping, including during daytime naps. ? While taking prescription pain medicines, sleeping medicines, or medicines that make you drowsy.  If you smoke, do not smoke without supervision.  Keep all follow-up visits as told by your health care provider. This is important. Contact a health care provider if:  You keep feeling nauseous or you keep vomiting.  You feel light-headed.  You develop a rash.  You have a fever. Get help right away if:  You have trouble breathing. Summary  For several hours after your procedure, you may feel sleepy and have poor judgment.  Have a  responsible adult stay with you for at least 24 hours or until you are awake and alert. This information is not intended to replace advice given to you by your health care provider. Make sure you discuss any questions you have with your health care provider. Document Revised: 08/25/2017 Document Reviewed: 09/17/2015 Elsevier Patient Education  [place].

## 2019-10-04 NOTE — Telephone Encounter (Signed)
Per Hoyle Sauer in endo patient cancelled pre-op appt via automated system. She has called pt several times and no return call. Called mobile, VM not set up. Called home# and a male stated she has told the patient the hospital keeps calling for him. She will advise him to call us to let us know what he wishes to do.

## 2019-10-05 ENCOUNTER — Encounter (HOSPITAL_COMMUNITY)
Admission: RE | Admit: 2019-10-05 | Discharge: 2019-10-05 | Disposition: A | Discharge status: Home / Self Care | Payer: Self-pay | Source: Ambulatory Visit | Attending: Internal Medicine | Admitting: Internal Medicine

## 2019-10-05 ENCOUNTER — Other Ambulatory Visit: Payer: Self-pay

## 2019-10-05 ENCOUNTER — Encounter (HOSPITAL_COMMUNITY): Payer: Self-pay

## 2019-10-05 ENCOUNTER — Other Ambulatory Visit (HOSPITAL_COMMUNITY)
Admission: RE | Admit: 2019-10-05 | Discharge: 2019-10-05 | Disposition: A | Discharge status: Home / Self Care | Payer: Self-pay | Source: Ambulatory Visit | Attending: Internal Medicine | Admitting: Internal Medicine

## 2019-10-05 ENCOUNTER — Telehealth: Payer: Self-pay

## 2019-10-05 NOTE — Telephone Encounter (Signed)
-----   Message from Encarnacion Chu, RN sent at 10/05/2019  3:31 PM EDT ----- Regarding: no show Oneal Grout. Emperor Hirte did not show for his PAT today.

## 2019-10-05 NOTE — Telephone Encounter (Signed)
Tried to call mobile#, no VM set-up. Tried to call home#, no answer. See phone note from yesterday also.

## 2019-10-06 NOTE — Telephone Encounter (Signed)
Melanie at endo called office, pt was no show for COVID test. LMOVM for endo scheduler to cancel procedure. We have been unable to contact him.

## 2019-10-07 ENCOUNTER — Encounter (HOSPITAL_COMMUNITY): Admission: RE | Payer: Self-pay | Source: Home / Self Care

## 2019-10-07 ENCOUNTER — Ambulatory Visit (HOSPITAL_COMMUNITY)
Admission: RE | Admit: 2019-10-07 | Payer: BLUE CROSS/BLUE SHIELD | Source: Home / Self Care | Admitting: Internal Medicine

## 2019-10-07 SURGERY — ESOPHAGOGASTRODUODENOSCOPY (EGD) WITH PROPOFOL
Anesthesia: Monitor Anesthesia Care

## 2019-10-07 NOTE — Telephone Encounter (Signed)
Noted, no further recommendations at this time. 

## 2019-12-22 ENCOUNTER — Ambulatory Visit: Payer: BLUE CROSS/BLUE SHIELD | Admitting: Nurse Practitioner

## 2019-12-23 ENCOUNTER — Ambulatory Visit: Payer: Self-pay | Admitting: Nurse Practitioner

## 2019-12-23 ENCOUNTER — Encounter: Payer: Self-pay | Admitting: Internal Medicine

## 2019-12-23 NOTE — Progress Notes (Deleted)
Referring Provider: Asencion Noble, MD Primary Care Physician:  Asencion Noble, MD Primary GI:  Dr. Gala Romney  No chief complaint on file.   HPI:   Shane Mayo is a 39 y.o. male who presents for follow-up.  The patient was last seen in our office 08/10/2019 for GERD, hemorrhoids, chronic diarrhea, dysphagia.  Noted chronic diarrhea and GERD.  Symptoms essentially started with diarrhea in October 2020 and was persistent since then.  Associated nausea, postprandial urgency, 2 stools a day.  Heartburn and regurgitation symptoms as well.  Recently diagnosed with diabetes type 2 and was started on Metformin in May 2020 but this was reduced due to possible GERD, and diarrhea.  However, no improvement resulted.  He was started on glipizide as well with hemoglobin A1c last documented over 10.  Normal celiac panel, normal thyroid function on file.  Colonoscopy up-to-date February 2021 with nonbleeding external and internal hemorrhoids, 3 sessile polyps that were a mix of tubular adenoma and benign colonic mucosa.  Recommended 5-year repeat (2026).  At last visit some increased epigastric pain and solid food odynophagia with occasional dysphagia.  Occasional regurgitation as well.  Still with GERD symptoms, but ran out of PPI few days ago but notes they were occurring while on PPI.  Still some scant hemorrhoid toilet tissue hematochezia on Preparation H not helping much.  No other overt GI complaints.  Recommended Anusol rectal cream, increase Protonix to 40 mg twice a day, notify of any worsening symptoms, EGD with possible dilation, Imodium over-the-counter as needed, notify of any worsening diarrhea, follow-up in 4 months.  EGD was originally scheduled for 10/07/2019.  However, the patient canceled by automated system and was a no-show to his preop visit.  We have had difficulty getting into since.  Today he states   Past Medical History:  Diagnosis Date  . Diabetes ([place])   . Herniated cervical disc   .  Kidney stone   . Renal disorder     Past Surgical History:  Procedure Laterality Date  . APPENDECTOMY  7  . BACK SURGERY  34  . COLONOSCOPY WITH PROPOFOL N/A 07/16/2019   Procedure: COLONOSCOPY WITH PROPOFOL;  Surgeon: Daneil Dolin, MD;  Location: [facility] SUITE;  Service: Endoscopy;  Laterality: N/A;  10:15am  . KNEE ARTHROSCOPY  2017   ACL  . LITHOTRIPSY    . POLYPECTOMY  07/16/2019   Procedure: POLYPECTOMY;  Surgeon: Daneil Dolin, MD;  Location: [facility];  Service: Endoscopy;;  cecal; sigmoidx2    Current Outpatient Medications  Medication Sig Dispense Refill  . glipiZIDE (GLUCOTROL) 5 MG tablet Take 1 tablet (5 mg total) by mouth daily before breakfast. 30 tablet 0  . hydrocortisone (ANUSOL-HC) 2.5 % rectal cream Place 1 application rectally 2 (two) times daily as needed for hemorrhoids or anal itching (for up to 10 days at a time). 30 g 1  . hydrocortisone cream 1 % Apply 1 application topically as needed for itching.    Marland Kitchen ibuprofen (ADVIL) 200 MG tablet Take 400 mg by mouth every 8 (eight) hours as needed (for pain.).    Marland Kitchen metFORMIN (GLUCOPHAGE) 1000 MG tablet Take 500 mg by mouth 2 (two) times daily with a meal.    . Multiple Vitamin (MULTIVITAMIN WITH MINERALS) TABS tablet Take 1 tablet by mouth daily. Alive for Men Multivitamin    . pantoprazole (PROTONIX) 40 MG tablet Take 1 tablet (40 mg total) by mouth 2 (two) times daily before a meal.  180 tablet 1   No current facility-administered medications for this visit.    Allergies as of 12/23/2019  . (No Known Allergies)    Family History  Problem Relation Age of Onset  . Diabetes Father   . Colon cancer Neg Hx   . Celiac disease Neg Hx   . Inflammatory bowel disease Neg Hx     Social History   Socioeconomic History  . Marital status: Single    Spouse name: Not on file  . Number of children: Not on file  . Years of education: Not on file  . Highest education level: Not on file  Occupational History  . Not  on file  Tobacco Use  . Smoking status: Current Every Day Smoker    Packs/day: 1.00    Types: Cigarettes  . Smokeless tobacco: Never Used  Vaping Use  . Vaping Use: Never used  Substance and Sexual Activity  . Alcohol use: Not Currently    Comment: never heavy or daily drinking. every couple of months or so may have a drink  . Drug use: No  . Sexual activity: Not on file  Other Topics Concern  . Not on file  Social History Narrative  . Not on file   Social Determinants of Health   Financial Resource Strain:   . Difficulty of Paying Living Expenses:   Food Insecurity:   . Worried About Charity fundraiser in the Last Year:   . Arboriculturist in the Last Year:   Transportation Needs:   . Film/video editor (Medical):   Marland Kitchen Lack of Transportation (Non-Medical):   Physical Activity:   . Days of Exercise per Week:   . Minutes of Exercise per Session:   Stress:   . Feeling of Stress :   Social Connections:   . Frequency of Communication with Friends and Family:   . Frequency of Social Gatherings with Friends and Family:   . Attends Religious Services:   . Active Member of Clubs or Organizations:   . Attends Archivist Meetings:   Marland Kitchen Marital Status:     Subjective: Review of Systems  Constitutional: Negative for chills, fever, malaise/fatigue and weight loss.  HENT: Negative for congestion and sore throat.   Respiratory: Negative for cough and shortness of breath.   Cardiovascular: Negative for chest pain and palpitations.  Gastrointestinal: Negative for abdominal pain, blood in stool, diarrhea, melena, nausea and vomiting.  Musculoskeletal: Negative for joint pain and myalgias.  Skin: Negative for rash.  Neurological: Negative for dizziness and weakness.  Endo/Heme/Allergies: Does not bruise/bleed easily.  Psychiatric/Behavioral: Negative for depression. The patient is not nervous/anxious.   All other systems reviewed and are  negative.    Objective: There were no vitals taken for this visit. Physical Exam Vitals and nursing note reviewed.  Constitutional:      General: He is not in acute distress.    Appearance: Normal appearance. He is not ill-appearing, toxic-appearing or diaphoretic.  HENT:     Head: Normocephalic and atraumatic.     Nose: No congestion or rhinorrhea.  Eyes:     General: No scleral icterus. Cardiovascular:     Rate and Rhythm: Normal rate and regular rhythm.     Heart sounds: Normal heart sounds.  Pulmonary:     Effort: Pulmonary effort is normal.     Breath sounds: Normal breath sounds.  Abdominal:     General: Bowel sounds are normal. There is no distension.  Palpations: Abdomen is soft. There is no hepatomegaly, splenomegaly or mass.     Tenderness: There is no abdominal tenderness. There is no guarding or rebound.     Hernia: No hernia is present.  Musculoskeletal:     Cervical back: Neck supple.  Skin:    General: Skin is warm and dry.     Coloration: Skin is not jaundiced.     Findings: No bruising or rash.  Neurological:     General: No focal deficit present.     Mental Status: He is alert and oriented to person, place, and time. Mental status is at baseline.  Psychiatric:        Mood and Affect: Mood normal.        Behavior: Behavior normal.        Thought Content: Thought content normal.       12/23/2019 7:57 AM   Disclaimer: This note was dictated with voice recognition software. Similar sounding words can inadvertently be transcribed and may not be corrected upon review.

## 2021-02-15 DIAGNOSIS — E119 Type 2 diabetes mellitus without complications: Secondary | ICD-10-CM | POA: Diagnosis not present

## 2021-02-15 DIAGNOSIS — Z79899 Other long term (current) drug therapy: Secondary | ICD-10-CM | POA: Diagnosis not present

## 2021-02-15 DIAGNOSIS — E785 Hyperlipidemia, unspecified: Secondary | ICD-10-CM | POA: Diagnosis not present

## 2021-02-16 DIAGNOSIS — E1169 Type 2 diabetes mellitus with other specified complication: Secondary | ICD-10-CM | POA: Diagnosis not present

## 2021-02-16 DIAGNOSIS — E785 Hyperlipidemia, unspecified: Secondary | ICD-10-CM | POA: Diagnosis not present

## 2021-02-16 DIAGNOSIS — R7309 Other abnormal glucose: Secondary | ICD-10-CM | POA: Diagnosis not present

## 2021-08-31 ENCOUNTER — Emergency Department (HOSPITAL_COMMUNITY)
Admission: EM | Admit: 2021-08-31 | Discharge: 2021-08-31 | Disposition: A | Discharge status: Home / Self Care | Payer: BC Managed Care – PPO | Attending: Emergency Medicine | Admitting: Emergency Medicine

## 2021-08-31 ENCOUNTER — Other Ambulatory Visit: Payer: Self-pay

## 2021-08-31 ENCOUNTER — Encounter (HOSPITAL_COMMUNITY): Payer: Self-pay

## 2021-08-31 DIAGNOSIS — R000 Tachycardia, unspecified: Secondary | ICD-10-CM | POA: Diagnosis not present

## 2021-08-31 DIAGNOSIS — R739 Hyperglycemia, unspecified: Secondary | ICD-10-CM | POA: Insufficient documentation

## 2021-08-31 DIAGNOSIS — R1013 Epigastric pain: Secondary | ICD-10-CM

## 2021-08-31 DIAGNOSIS — F17210 Nicotine dependence, cigarettes, uncomplicated: Secondary | ICD-10-CM | POA: Insufficient documentation

## 2021-08-31 DIAGNOSIS — R197 Diarrhea, unspecified: Secondary | ICD-10-CM | POA: Insufficient documentation

## 2021-08-31 DIAGNOSIS — Z7984 Long term (current) use of oral hypoglycemic drugs: Secondary | ICD-10-CM | POA: Diagnosis not present

## 2021-08-31 LAB — CBC WITH DIFFERENTIAL/PLATELET
Abs Immature Granulocytes: 0.03 10*3/uL (ref 0.00–0.07)
Basophils Absolute: 0.1 10*3/uL (ref 0.0–0.1)
Basophils Relative: 1 %
Eosinophils Absolute: 0.4 10*3/uL (ref 0.0–0.5)
Eosinophils Relative: 5 %
HCT: 47.3 % (ref 39.0–52.0)
Hemoglobin: 16.5 g/dL (ref 13.0–17.0)
Immature Granulocytes: 0 %
Lymphocytes Relative: 28 %
Lymphs Abs: 2.6 10*3/uL (ref 0.7–4.0)
MCH: 30.3 pg (ref 26.0–34.0)
MCHC: 34.9 g/dL (ref 30.0–36.0)
MCV: 86.8 fL (ref 80.0–100.0)
Monocytes Absolute: 0.6 10*3/uL (ref 0.1–1.0)
Monocytes Relative: 6 %
Neutro Abs: 5.6 10*3/uL (ref 1.7–7.7)
Neutrophils Relative %: 60 %
Platelets: 304 10*3/uL (ref 150–400)
RBC: 5.45 MIL/uL (ref 4.22–5.81)
RDW: 11.8 % (ref 11.5–15.5)
WBC: 9.3 10*3/uL (ref 4.0–10.5)
nRBC: 0 % (ref 0.0–0.2)

## 2021-08-31 LAB — COMPREHENSIVE METABOLIC PANEL
ALT: 19 U/L (ref 0–44)
AST: 17 U/L (ref 15–41)
Albumin: 4.5 g/dL (ref 3.5–5.0)
Alkaline Phosphatase: 97 U/L (ref 38–126)
Anion gap: 11 (ref 5–15)
BUN: 16 mg/dL (ref 6–20)
CO2: 26 mmol/L (ref 22–32)
Calcium: 9.4 mg/dL (ref 8.9–10.3)
Chloride: 98 mmol/L (ref 98–111)
Creatinine, Ser: 0.77 mg/dL (ref 0.61–1.24)
GFR, Estimated: >60 mL/min (ref >60)
Glucose, Bld: 255 mg/dL — ABNORMAL HIGH (ref 70–99)
Potassium: 3.9 mmol/L (ref 3.5–5.1)
Sodium: 135 mmol/L (ref 135–145)
Total Bilirubin: 0.6 mg/dL (ref 0.3–1.2)
Total Protein: 8.3 g/dL — ABNORMAL HIGH (ref 6.5–8.1)

## 2021-08-31 LAB — LIPASE, BLOOD: Lipase: 36 U/L (ref 11–51)

## 2021-08-31 MED ORDER — ONDANSETRON HCL 4 MG/2ML IJ SOLN
4.0000 mg | Freq: Once | INTRAMUSCULAR | Status: AC
  Administered 2021-08-31: 4 mg via INTRAVENOUS
  Filled 2021-08-31: qty 2

## 2021-08-31 MED ORDER — SODIUM CHLORIDE 0.9 % IV BOLUS
1000.0000 mL | Freq: Once | INTRAVENOUS | Status: AC
  Administered 2021-08-31: 1000 mL via INTRAVENOUS

## 2021-08-31 MED ORDER — FAMOTIDINE 20 MG PO TABS: 20.0000 mg | ORAL_TABLET | Freq: Two times a day (BID) | ORAL | 0 refills | Status: DC

## 2021-08-31 MED ORDER — DICYCLOMINE HCL 10 MG PO CAPS
10.0000 mg | ORAL_CAPSULE | Freq: Once | ORAL | Status: AC
  Administered 2021-08-31: 10 mg via ORAL
  Filled 2021-08-31: qty 1

## 2021-08-31 MED ORDER — DICYCLOMINE HCL 20 MG PO TABS: 20.0000 mg | ORAL_TABLET | Freq: Two times a day (BID) | ORAL | 0 refills | Status: DC

## 2021-08-31 MED ORDER — FENTANYL CITRATE PF 50 MCG/ML IJ SOSY
50.0000 ug | PREFILLED_SYRINGE | Freq: Once | INTRAMUSCULAR | Status: AC
  Administered 2021-08-31: 50 ug via INTRAVENOUS
  Filled 2021-08-31: qty 1

## 2021-08-31 NOTE — ED Triage Notes (Signed)
Pt arrived via POV c/o epigastric RUQ abdominal pain worsening over past 3 weeks. Pt reports nausea w/o emesis and diarrhea X 4 episodes.  ?

## 2021-08-31 NOTE — Discharge Instructions (Signed)
Your work-up today in the ED was reassuring.  I would advise working on quitting cigarettes.  Take the Pepcid daily.  Take Bentyl up to twice daily for pain.  Can take Tylenol, I would avoid any Motrin.  Follow-up with the primary if your symptoms persist, return to ED if things worsen. ?

## 2021-08-31 NOTE — ED Provider Notes (Signed)
?[place] ?Provider Note ? ? ?CSN: [id number] ?Arrival date & time: 08/31/21  1934 ? ?  ? ?History ? ?Chief Complaint  ?Patient presents with  ? Abdominal Pain  ? ? ?Shane Mayo is a 41 y.o. male. ? ? ?Abdominal Pain ? ?Patient presents with abdominal pain.  He has nausea and 1 episode of emesis, reports diarrhea.  He feels constipated but reports whenever he has a bowel movement it is diarrhea.'s been going on for 3 weeks, its intermittent.  The pain is epigastric and right upper quadrant, unable to identify alleviating or aggravating factors.  Denies any association with food or movement.  Status post appendectomy, denies any other abdominal surgeries.  Smokes 1 pack of cigarettes daily.  ? ?Home Medications ?Prior to Admission medications   ?Medication Sig Start Date End Date Taking? Authorizing Provider  ?dicyclomine (BENTYL) 20 MG tablet Take 1 tablet (20 mg total) by mouth 2 (two) times daily. 08/31/21  Yes Sherrill Raring, PA-C  ?famotidine (PEPCID) 20 MG tablet Take 1 tablet (20 mg total) by mouth 2 (two) times daily. 08/31/21  Yes Sherrill Raring, PA-C  ?glipiZIDE (GLUCOTROL) 5 MG tablet Take 1 tablet (5 mg total) by mouth daily before breakfast. 10/19/18   Idol, Almyra Free, PA-C  ?hydrocortisone (ANUSOL-HC) 2.5 % rectal cream Place 1 application rectally 2 (two) times daily as needed for hemorrhoids or anal itching (for up to 10 days at a time). 08/10/19   Carlis Stable, NP  ?hydrocortisone cream 1 % Apply 1 application topically as needed for itching.    [provider]  ?ibuprofen (ADVIL) 200 MG tablet Take 400 mg by mouth every 8 (eight) hours as needed (for pain.).    [provider]  ?metFORMIN (GLUCOPHAGE) 1000 MG tablet Take 500 mg by mouth 2 (two) times daily with a meal.    [provider]  ?Multiple Vitamin (MULTIVITAMIN WITH MINERALS) TABS tablet Take 1 tablet by mouth daily. Alive for Men Multivitamin    [provider]  ?pantoprazole (PROTONIX) 40 MG  tablet Take 1 tablet (40 mg total) by mouth 2 (two) times daily before a meal. 08/10/19   Carlis Stable, NP  ?   ? ?Allergies    ?Patient has no known allergies.   ? ?Review of Systems   ?Review of Systems  ?Gastrointestinal:  Positive for abdominal pain.  ? ?Physical Exam ?Updated Vital Signs ?BP (!) 128/95 (BP Location: Left Arm)   Pulse 98   Temp 98.3 ?F (36.8 ?C) (Oral)   Resp 14   Ht '6\' 3"'$  (1.905 m)   Wt 86.2 kg   SpO2 98%   BMI 23.75 kg/m?  ?Physical Exam ?Vitals and nursing note reviewed. Exam conducted with a chaperone present.  ?Constitutional:   ?   General: He is not in acute distress. ?   Appearance: Normal appearance.  ?HENT:  ?   Head: Normocephalic and atraumatic.  ?Eyes:  ?   General: No scleral icterus.    ?   Right eye: No discharge.     ?   Left eye: No discharge.  ?   Extraocular Movements: Extraocular movements intact.  ?   Pupils: Pupils are equal, round, and reactive to light.  ?Cardiovascular:  ?   Rate and Rhythm: Regular rhythm. Tachycardia present.  ?   Pulses: Normal pulses.  ?   Heart sounds: Normal heart sounds. No murmur heard. ?  No friction rub. No gallop.  ?   Comments:  Mild tachycardia at 104 ?Pulmonary:  ?   Effort: Pulmonary effort is normal. No respiratory distress.  ?   Breath sounds: Normal breath sounds.  ?Abdominal:  ?   General: Abdomen is flat. Bowel sounds are normal. There is no distension.  ?   Palpations: Abdomen is soft.  ?   Tenderness: There is abdominal tenderness in the right upper quadrant and epigastric area.  ?Skin: ?   General: Skin is warm and dry.  ?   Coloration: Skin is not jaundiced.  ?Neurological:  ?   Mental Status: He is alert. Mental status is at baseline.  ?   Coordination: Coordination normal.  ? ? ?ED Results / Procedures / Treatments   ?Labs ?(all labs ordered are listed, but only abnormal results are displayed) ?Labs Reviewed  ?COMPREHENSIVE METABOLIC PANEL - Abnormal; Notable for the following components:  ?    Result Value  ? Glucose, Bld  255 (*)   ? Total Protein 8.3 (*)   ? All other components within normal limits  ?CBC WITH DIFFERENTIAL/PLATELET  ?LIPASE, BLOOD  ? ? ?EKG ?None ? ?Radiology ?No results found. ? ?Procedures ?Procedures  ? ? ?Medications Ordered in ED ?Medications  ?sodium chloride 0.9 % bolus 1,000 mL (0 mLs Intravenous Stopped 08/31/21 2128)  ?ondansetron [facility]) injection 4 mg (4 mg Intravenous Given 08/31/21 2023)  ?fentaNYL (SUBLIMAZE) injection 50 mcg (50 mcg Intravenous Given 08/31/21 2024)  ?dicyclomine (BENTYL) capsule 10 mg (10 mg Oral Given 08/31/21 2127)  ? ? ?ED Course/ Medical Decision Making/ A&P ?  ?                        ?Medical Decision Making ?Amount and/or Complexity of Data Reviewed ?Labs: ordered. ? ?Risk ?Prescription drug management. ? ? ?This is a 41 year old male presenting due to abdominal pain, nausea, emesis and diarrhea x3 weeks intermittently.  Differential diagnosis includes but is not limited to dehydration, sepsis, acute intra-abdominal process, cholecystitis, gastritis, gastroenteritis, other ? ? ?Heart rate was slightly elevated during initial vitals, on repeat his heart rate decreased to 98.  Abdominal exam relatively benign, there is no rigidity or guarding.  He has diffuse abdominal tenderness without any focality that would be suggestive of an acute process.  He is additionally status post appendectomy.   ? ?I ordered and viewed labs.  Based on my independent interpretation the following is notable: He is hyperglycemic although does not appear in DKA given potassium and bicarb within normal limits.  Lipase within normal limits, no leukocytosis or anemia noted. ? ?Although I considered abdominal injury and given the lack of focality have a low suspicion for any acute intra-abdominal process.  Considered cholecystitis but I feel that is less likely.  Gastritis versus gastroenteritis higher my differential. ? ?On reevaluation patient feels improved after being given fluids and nausea medicine.   Bentyl also provided.  Will discharge patient with Bentyl and Pepcid.  He is in agreement with the plan. ? ? ? ? ? ? ? ?Final Clinical Impression(s) / ED Diagnoses ?Final diagnoses:  ?Epigastric pain  ? ? ?Rx / DC Orders ?ED Discharge Orders   ? ?      Ordered  ?  famotidine (PEPCID) 20 MG tablet  2 times daily       ? 08/31/21 2110  ?  dicyclomine (BENTYL) 20 MG tablet  2 times daily       ? 08/31/21 2110  ? ?  ?  ? ?  ? ? ?  ?  Sherrill Raring, PA-C ?08/31/21 2257 ? ?  ?Daleen Bo, MD ?08/31/21 2331 ? ?

## 2021-09-14 DIAGNOSIS — R1011 Right upper quadrant pain: Secondary | ICD-10-CM | POA: Diagnosis not present

## 2021-09-14 DIAGNOSIS — E785 Hyperlipidemia, unspecified: Secondary | ICD-10-CM | POA: Diagnosis not present

## 2021-09-19 DIAGNOSIS — E118 Type 2 diabetes mellitus with unspecified complications: Secondary | ICD-10-CM | POA: Diagnosis not present

## 2021-09-19 DIAGNOSIS — R109 Unspecified abdominal pain: Secondary | ICD-10-CM | POA: Diagnosis not present

## 2021-09-21 ENCOUNTER — Other Ambulatory Visit: Payer: Self-pay | Admitting: Internal Medicine

## 2021-09-21 ENCOUNTER — Other Ambulatory Visit (HOSPITAL_COMMUNITY): Payer: Self-pay | Admitting: Internal Medicine

## 2021-09-21 DIAGNOSIS — R1013 Epigastric pain: Secondary | ICD-10-CM

## 2021-09-21 DIAGNOSIS — R1011 Right upper quadrant pain: Secondary | ICD-10-CM

## 2021-09-27 ENCOUNTER — Ambulatory Visit (HOSPITAL_COMMUNITY)
Admission: RE | Admit: 2021-09-27 | Discharge: 2021-09-27 | Disposition: A | Discharge status: Home / Self Care | Payer: BC Managed Care – PPO | Source: Ambulatory Visit | Attending: Internal Medicine | Admitting: Internal Medicine

## 2021-09-27 DIAGNOSIS — R1011 Right upper quadrant pain: Secondary | ICD-10-CM

## 2021-09-27 DIAGNOSIS — R1013 Epigastric pain: Secondary | ICD-10-CM | POA: Diagnosis not present

## 2021-10-15 DIAGNOSIS — E1169 Type 2 diabetes mellitus with other specified complication: Secondary | ICD-10-CM | POA: Diagnosis not present

## 2021-10-15 DIAGNOSIS — R1011 Right upper quadrant pain: Secondary | ICD-10-CM | POA: Diagnosis not present

## 2022-01-14 DIAGNOSIS — E118 Type 2 diabetes mellitus with unspecified complications: Secondary | ICD-10-CM | POA: Diagnosis not present

## 2022-02-12 ENCOUNTER — Emergency Department (HOSPITAL_COMMUNITY)
Admission: EM | Admit: 2022-02-12 | Discharge: 2022-02-12 | Disposition: A | Discharge status: Home / Self Care | Payer: Self-pay | Attending: Emergency Medicine | Admitting: Emergency Medicine

## 2022-02-12 ENCOUNTER — Other Ambulatory Visit: Payer: Self-pay

## 2022-02-12 ENCOUNTER — Encounter (HOSPITAL_COMMUNITY): Payer: Self-pay

## 2022-02-12 ENCOUNTER — Emergency Department (HOSPITAL_COMMUNITY): Payer: BC Managed Care – PPO

## 2022-02-12 DIAGNOSIS — Z7984 Long term (current) use of oral hypoglycemic drugs: Secondary | ICD-10-CM | POA: Insufficient documentation

## 2022-02-12 DIAGNOSIS — K5900 Constipation, unspecified: Secondary | ICD-10-CM | POA: Insufficient documentation

## 2022-02-12 DIAGNOSIS — E119 Type 2 diabetes mellitus without complications: Secondary | ICD-10-CM | POA: Insufficient documentation

## 2022-02-12 DIAGNOSIS — K6389 Other specified diseases of intestine: Secondary | ICD-10-CM | POA: Diagnosis not present

## 2022-02-12 DIAGNOSIS — R1013 Epigastric pain: Secondary | ICD-10-CM | POA: Diagnosis not present

## 2022-02-12 DIAGNOSIS — R110 Nausea: Secondary | ICD-10-CM | POA: Insufficient documentation

## 2022-02-12 DIAGNOSIS — K649 Unspecified hemorrhoids: Secondary | ICD-10-CM

## 2022-02-12 DIAGNOSIS — K219 Gastro-esophageal reflux disease without esophagitis: Secondary | ICD-10-CM

## 2022-02-12 DIAGNOSIS — N281 Cyst of kidney, acquired: Secondary | ICD-10-CM | POA: Diagnosis not present

## 2022-02-12 DIAGNOSIS — R1010 Upper abdominal pain, unspecified: Secondary | ICD-10-CM | POA: Insufficient documentation

## 2022-02-12 LAB — LIPASE, BLOOD: Lipase: 26 U/L (ref 11–51)

## 2022-02-12 LAB — CBC
HCT: 45.4 % (ref 39.0–52.0)
Hemoglobin: 16.2 g/dL (ref 13.0–17.0)
MCH: 31.7 pg (ref 26.0–34.0)
MCHC: 35.7 g/dL (ref 30.0–36.0)
MCV: 88.8 fL (ref 80.0–100.0)
Platelets: 327 10*3/uL (ref 150–400)
RBC: 5.11 MIL/uL (ref 4.22–5.81)
RDW: 11.8 % (ref 11.5–15.5)
WBC: 17.9 10*3/uL — ABNORMAL HIGH (ref 4.0–10.5)
nRBC: 0 % (ref 0.0–0.2)

## 2022-02-12 LAB — COMPREHENSIVE METABOLIC PANEL
ALT: 24 U/L (ref 0–44)
AST: 20 U/L (ref 15–41)
Albumin: 4.4 g/dL (ref 3.5–5.0)
Alkaline Phosphatase: 80 U/L (ref 38–126)
Anion gap: 10 (ref 5–15)
BUN: 14 mg/dL (ref 6–20)
CO2: 24 mmol/L (ref 22–32)
Calcium: 10 mg/dL (ref 8.9–10.3)
Chloride: 103 mmol/L (ref 98–111)
Creatinine, Ser: 0.63 mg/dL (ref 0.61–1.24)
GFR, Estimated: >60 mL/min (ref >60)
Glucose, Bld: 221 mg/dL — ABNORMAL HIGH (ref 70–99)
Potassium: 3.2 mmol/L — ABNORMAL LOW (ref 3.5–5.1)
Sodium: 137 mmol/L (ref 135–145)
Total Bilirubin: 1 mg/dL (ref 0.3–1.2)
Total Protein: 8.1 g/dL (ref 6.5–8.1)

## 2022-02-12 MED ORDER — SUCRALFATE 1 GM/10ML PO SUSP
1.0000 g | Freq: Once | ORAL | Status: AC
  Administered 2022-02-12: 1 g via ORAL
  Filled 2022-02-12: qty 10

## 2022-02-12 MED ORDER — IOHEXOL 300 MG/ML  SOLN
100.0000 mL | Freq: Once | INTRAMUSCULAR | Status: AC | PRN
  Administered 2022-02-12: 100 mL via INTRAVENOUS

## 2022-02-12 MED ORDER — MORPHINE SULFATE (PF) 4 MG/ML IV SOLN
4.0000 mg | Freq: Once | INTRAVENOUS | Status: AC
  Administered 2022-02-12: 4 mg via INTRAVENOUS
  Filled 2022-02-12: qty 1

## 2022-02-12 MED ORDER — SODIUM CHLORIDE 0.9 % IV BOLUS
1000.0000 mL | Freq: Once | INTRAVENOUS | Status: AC
  Administered 2022-02-12: 1000 mL via INTRAVENOUS

## 2022-02-12 MED ORDER — PANTOPRAZOLE SODIUM 40 MG PO TBEC: 40.0000 mg | DELAYED_RELEASE_TABLET | Freq: Two times a day (BID) | ORAL | 0 refills | Status: AC

## 2022-02-12 MED ORDER — SUCRALFATE 1 GM/10ML PO SUSP: 1.0000 g | Freq: Three times a day (TID) | ORAL | 0 refills | Status: AC

## 2022-02-12 MED ORDER — ALUM & MAG HYDROXIDE-SIMETH 200-200-20 MG/5ML PO SUSP
30.0000 mL | Freq: Once | ORAL | Status: AC
  Administered 2022-02-12: 30 mL via ORAL
  Filled 2022-02-12: qty 30

## 2022-02-12 MED ORDER — ONDANSETRON HCL 4 MG/2ML IJ SOLN
4.0000 mg | Freq: Once | INTRAMUSCULAR | Status: AC
  Administered 2022-02-12: 4 mg via INTRAVENOUS
  Filled 2022-02-12: qty 2

## 2022-02-12 MED ORDER — LIDOCAINE VISCOUS HCL 2 % MT SOLN
15.0000 mL | Freq: Once | OROMUCOSAL | Status: AC
  Administered 2022-02-12: 15 mL via ORAL
  Filled 2022-02-12: qty 15

## 2022-02-12 MED ORDER — FAMOTIDINE IN NACL 20-0.9 MG/50ML-% IV SOLN
20.0000 mg | Freq: Once | INTRAVENOUS | Status: AC
  Administered 2022-02-12: 20 mg via INTRAVENOUS
  Filled 2022-02-12: qty 50

## 2022-02-12 MED ORDER — ONDANSETRON HCL 4 MG PO TABS
4.0000 mg | ORAL_TABLET | Freq: Four times a day (QID) | ORAL | 0 refills | Status: AC
Start: 2022-02-12 — End: ?

## 2022-02-12 NOTE — ED Triage Notes (Signed)
Pt presents to ED with complaints of mid upper abdominal pain radiating around to right side of back. Pt states he has been nauseated.

## 2022-02-12 NOTE — ED Provider Notes (Signed)
[facility] EMERGENCY DEPARTMENT Provider Note   CSN: [id number] Arrival date & time: 02/12/22  0759     History  Chief Complaint  Patient presents with   Abdominal Pain    Shane Mayo is a 41 y.o. male.  Shane Mayo is a 41 y.o. male with a history of diabetes, prior appendectomy, who presents to the ED for evaluation of upper abdominal pain.  Patient reports that pain started about 6 days ago but has been getting progressively worse.  He reports it is a constant ache in the upper abdomen that radiates to the right side of his abdomen and down his side.  He has had some associated nausea but no vomiting, no hematemesis.  Reports he has not been able to have a bowel movement since Friday and has not been passing much gas.  He denies any associated chest pain or shortness of breath.  No fevers or chills.  He has had some similar abdominal issues previously but never this severe.  Was supposed to have an endoscopy in 2021 but missed this appointment.  Has been previously followed by Dr. Gala Romney with GI and follows with his PCP regarding intermittent abdominal issues.  Aside from appendectomy no other abdominal surgeries.  No meds prior to arrival.  The history is provided by the patient and medical records.  Abdominal Pain Associated symptoms: constipation and nausea   Associated symptoms: no chest pain, no chills, no cough, no diarrhea, no dysuria, no fever, no shortness of breath and no vomiting        Home Medications Prior to Admission medications   Medication Sig Start Date End Date Taking? Authorizing Provider  glipiZIDE (GLUCOTROL XL) 5 MG 24 hr tablet Take 5 mg by mouth every morning. 09/21/21  Yes [provider]  metFORMIN (GLUCOPHAGE) 500 MG tablet Take 500 mg by mouth 2 (two) times daily. 09/21/21  Yes [provider]  ondansetron (ZOFRAN) 4 MG tablet Take 1 tablet (4 mg total) by mouth every 6 (six) hours. 02/12/22  Yes Jacqlyn Larsen, PA-C   pioglitazone (ACTOS) 15 MG tablet Take 15 mg by mouth daily. 10/15/21  Yes [provider]  sucralfate (CARAFATE) 1 GM/10ML suspension Take 10 mLs (1 g total) by mouth 4 (four) times daily -  with meals and at bedtime. 02/12/22  Yes Jacqlyn Larsen, PA-C  pantoprazole (PROTONIX) 40 MG tablet Take 1 tablet (40 mg total) by mouth 2 (two) times daily before a meal. 02/12/22   Jacqlyn Larsen, PA-C      Allergies    Patient has no known allergies.    Review of Systems   Review of Systems  Constitutional:  Negative for chills and fever.  HENT: Negative.    Respiratory:  Negative for cough and shortness of breath.   Cardiovascular:  Negative for chest pain.  Gastrointestinal:  Positive for abdominal pain, constipation and nausea. Negative for diarrhea and vomiting.  Genitourinary:  Negative for dysuria and frequency.  Musculoskeletal:  Negative for arthralgias and myalgias.  Skin:  Negative for color change and rash.  All other systems reviewed and are negative.   Physical Exam Updated Vital Signs BP (!) 132/91 (BP Location: Right Arm)   Pulse (!) 103   Temp 98.1 F (36.7 C) (Oral)   Resp 18   Ht '6\' 3"'$  (1.905 m)   Wt 88.5 kg   SpO2 93%   BMI 24.37 kg/m  Physical Exam Vitals and nursing note reviewed.  Constitutional:  General: He is not in acute distress.    Appearance: Normal appearance. He is well-developed. He is not diaphoretic.  HENT:     Head: Normocephalic and atraumatic.  Eyes:     General:        Right eye: No discharge.        Left eye: No discharge.     Pupils: Pupils are equal, round, and reactive to light.  Cardiovascular:     Rate and Rhythm: Normal rate and regular rhythm.     Pulses: Normal pulses.     Heart sounds: Normal heart sounds.  Pulmonary:     Effort: Pulmonary effort is normal. No respiratory distress.     Breath sounds: Normal breath sounds. No wheezing or rales.     Comments: Respirations equal and unlabored, patient able to speak in  full sentences, lungs clear to auscultation bilaterally  Abdominal:     General: Bowel sounds are normal. There is no distension.     Palpations: Abdomen is soft. There is no mass.     Tenderness: There is abdominal tenderness. There is no guarding.     Comments: Abdomen soft, nondistended, bowel sounds present, there is tenderness primarily in the epigastric region as well as some right upper quadrant and right lower quadrant tenderness, no left-sided abdominal tenderness, no guarding or peritoneal signs, no CVA tenderness  Musculoskeletal:        General: No deformity.     Cervical back: Neck supple.  Skin:    General: Skin is warm and dry.     Capillary Refill: Capillary refill takes less than 2 seconds.  Neurological:     Mental Status: He is alert and oriented to person, place, and time.     Coordination: Coordination normal.     Comments: Speech is clear, able to follow commands Moves extremities without ataxia, coordination intact  Psychiatric:        Mood and Affect: Mood normal.        Behavior: Behavior normal.     ED Results / Procedures / Treatments   Labs (all labs ordered are listed, but only abnormal results are displayed) Labs Reviewed  COMPREHENSIVE METABOLIC PANEL - Abnormal; Notable for the following components:      Result Value   Potassium 3.2 (*)    Glucose, Bld 221 (*)    All other components within normal limits  CBC - Abnormal; Notable for the following components:   WBC 17.9 (*)    All other components within normal limits  LIPASE, BLOOD  URINALYSIS, ROUTINE W REFLEX MICROSCOPIC    EKG EKG Interpretation  Date/Time:  Tuesday February 12 2022 08:20:54 EDT Ventricular Rate:  113 PR Interval:  142 QRS Duration: 104 QT Interval:  338 QTC Calculation: 463 R Axis:   16 Text Interpretation: Sinus tachycardia Incomplete right bundle branch block Borderline ECG No previous ECGs available No old tracing to compare Confirmed by Isla Pence [phone])  on 02/12/2022 9:23:20 AM  Radiology CT Abdomen Pelvis W Contrast  Result Date: 02/12/2022 CLINICAL DATA:  Epigastric pain, mid upper abdominal pain radiating around to RIGHT side of back, associated nausea EXAM: CT ABDOMEN AND PELVIS WITH CONTRAST TECHNIQUE: Multidetector CT imaging of the abdomen and pelvis was performed using the standard protocol following bolus administration of intravenous contrast. RADIATION DOSE REDUCTION: This exam was performed according to the departmental dose-optimization program which includes automated exposure control, adjustment of the mA and/or kV according to patient size and/or use of iterative reconstruction  technique. CONTRAST:  125m OMNIPAQUE IOHEXOL 300 MG/ML SOLN IV. No oral contrast. COMPARISON:  01/27/2014 FINDINGS: Lower chest: Lung bases clear Hepatobiliary: Gallbladder and liver normal appearance Pancreas: Normal appearance Spleen: Normal appearance Adrenals/Urinary Tract: Thickening of adrenal glands bilaterally without discrete mass/nodule. Tiny nonspecific RIGHT renal cyst 10 mm diameter; no follow-up imaging recommended. Kidneys, ureters, and bladder otherwise normal appearance. Stomach/Bowel: Appendix surgically absent by history. Mild thickening of duodenal wall with indistinct periduodenal fat planes question duodenitis versus ulcer disease. A tiny fluid collection 11 x 6 mm is seen adjacent to the duodenal bulb, rib may represent a collapsed portion of the duodenal bulb rather than an extraluminal collection. No evidence of extraluminal gas or additional fluid collection. Stomach and remaining bowel loops normal appearance Vascular/Lymphatic: Atherosclerotic calcifications aorta and iliac arteries without aneurysm. Mild coronary arterial calcification. Circumaortic LEFT renal vein. Few scattered normal sized mesenteric and retroperitoneal lymph nodes without abdominal or pelvic adenopathy. Reproductive: Unremarkable prostate gland and seminal vesicles Other:  No free air or free fluid.  No hernia. Musculoskeletal: Degenerative disc disease changes L5-S1. Otherwise unremarkable. IMPRESSION: Mild thickening of duodenal wall with indistinct periduodenal fat planes either representing duodenitis or duodenal ulcer disease. Tiny 11 x 6 mm fluid collection adjacent to the duodenal bulb, may represent a collapsed portion of the duodenal bulb rather than an extraluminal collection, with no extraluminal gas identified to suggest perforated ulcer. Remainder of exam unremarkable. Aortic Atherosclerosis (ICD10-I70.0). Electronically Signed   By: MLavonia DanaM.D.   On: 02/12/2022 10:41    Procedures Procedures    Medications Ordered in ED Medications  sodium chloride 0.9 % bolus 1,000 mL (0 mLs Intravenous Stopped 02/12/22 1116)  ondansetron (ZOFRAN) injection 4 mg (4 mg Intravenous Given 02/12/22 1000)  morphine (PF) 4 MG/ML injection 4 mg (4 mg Intravenous Given 02/12/22 1000)  famotidine (PEPCID) IVPB 20 mg premix (0 mg Intravenous Stopped 02/12/22 1117)  iohexol (OMNIPAQUE) 300 MG/ML solution 100 mL (100 mLs Intravenous Contrast Given 02/12/22 1019)  alum & mag hydroxide-simeth (MAALOX/MYLANTA) 200-200-20 MG/5ML suspension 30 mL (30 mLs Oral Given 02/12/22 1117)    And  lidocaine (XYLOCAINE) 2 % viscous mouth solution 15 mL (15 mLs Oral Given 02/12/22 1117)  sucralfate (CARAFATE) 1 GM/10ML suspension 1 g (1 g Oral Given 02/12/22 1206)    ED Course/ Medical Decision Making/ A&P                           Medical Decision Making Amount and/or Complexity of Data Reviewed Labs: ordered. Radiology: ordered.  Risk OTC drugs. Prescription drug management.   Patient presents to the ED with complaints of abdominal pain. Patient nontoxic appearing, in no apparent distress, vitals significant for very mild tachycardia and hypertension on arrival. On exam patient tender to across the upper abdomen, no peritoneal signs. Will evaluate with labs and CT abdomen pelvis.   Ddx  including but not limited to: Gastritis, peptic ulcer disease, pancreatitis, cholecystitis, perforation, obstruction  Additional history obtained:  Additional history obtained from chart review & nursing note review.   Lab Tests:  I Ordered, viewed, and interpreted labs, which included:  CBC: Leukocytosis of 17.9, normal hemoglobin CMP: Potassium 3.2, glucose 221, no other electrolyte derangements, normal renal and liver function Lipase: WNL  Imaging Studies ordered:  I ordered imaging studies which included CT abdomen pelvis, I independently viewed and interpreted imaging, formal radiology impression shows:  Mild thickening of the duodenal wall, tiny fluid collection likely  represents portion of the duodenal bulb rather than an extraluminal collection, no extraluminal gas to suggest perforated ulcer  ED Course:  I ordered IV fluid bolus, Pepcid, morphine, Zofran, GI cocktail and Carafate   RE-EVAL: Abdominal pain improved, tolerating p.o.  On repeat abdominal exam patient remains without peritoneal signs, low suspicion for cholecystitis, pancreatitis, diverticulitis, appendicitis, bowel obstruction/perforation, or other acute surgical process.  CT concerning for duodenitis or duodenal ulcer but no evidence of perforation.  Patient tolerating PO in the emergency department. Will discharge home with, patient started on PPI and Carafate. I discussed results, treatment plan, need for GI follow-up, and return precautions with the patient. Provided opportunity for questions, patient confirmed understanding and is in agreement with plan.   Portions of this note were generated with [employer]. Dictation errors may occur despite best attempts at proofreading.           Final Clinical Impression(s) / ED Diagnoses Final diagnoses:  Pain of upper abdomen    Rx / DC Orders ED Discharge Orders          Ordered    pantoprazole (PROTONIX) 40 MG tablet  2 times daily before  meals        02/12/22 1208    ondansetron (ZOFRAN) 4 MG tablet  Every 6 hours        02/12/22 1208    sucralfate (CARAFATE) 1 GM/10ML suspension  3 times daily with meals & bedtime        02/12/22 1208              Jacqlyn Larsen, PA-C 03/08/22 0957    Isla Pence, MD 03/11/22 [phone]

## 2022-02-12 NOTE — Discharge Instructions (Addendum)
Your CT scan shows inflammation or ulcer of the duodenum which is the first part of your small intestine that is likely causing your pain.  To treat this began taking Protonix twice daily before breakfast and dinnertime.  You can use Zofran as needed for any nausea or vomiting.  Take Carafate 3 times daily with meals and before bedtime to help protect potential ulcer and help with pain.  Please call today to schedule follow-up appointment with your GI doctor.  If you develop significantly worsening pain or swelling of your abdomen, fevers, vomiting that is persistent or has any blood in it or blood in your stool or dark black stool please return for reevaluation.

## 2022-02-14 DIAGNOSIS — K2980 Duodenitis without bleeding: Secondary | ICD-10-CM | POA: Diagnosis not present

## 2022-02-14 DIAGNOSIS — R7309 Other abnormal glucose: Secondary | ICD-10-CM | POA: Diagnosis not present

## 2022-02-14 DIAGNOSIS — E1169 Type 2 diabetes mellitus with other specified complication: Secondary | ICD-10-CM | POA: Diagnosis not present

## 2022-02-14 DIAGNOSIS — I700 Atherosclerosis of aorta: Secondary | ICD-10-CM | POA: Diagnosis not present

## 2022-02-15 ENCOUNTER — Encounter: Payer: Self-pay | Admitting: Internal Medicine

## 2022-03-05 ENCOUNTER — Ambulatory Visit: Payer: Self-pay | Admitting: Internal Medicine

## 2022-10-14 ENCOUNTER — Other Ambulatory Visit: Payer: Self-pay

## 2022-10-14 ENCOUNTER — Encounter (HOSPITAL_COMMUNITY): Payer: Self-pay | Admitting: Emergency Medicine

## 2022-10-14 ENCOUNTER — Emergency Department (HOSPITAL_COMMUNITY)
Admission: EM | Admit: 2022-10-14 | Discharge: 2022-10-15 | Disposition: A | Discharge status: Home / Self Care | Payer: BC Managed Care – PPO | Attending: Emergency Medicine | Admitting: Emergency Medicine

## 2022-10-14 DIAGNOSIS — M542 Cervicalgia: Secondary | ICD-10-CM

## 2022-10-14 DIAGNOSIS — Z7984 Long term (current) use of oral hypoglycemic drugs: Secondary | ICD-10-CM | POA: Diagnosis not present

## 2022-10-14 DIAGNOSIS — R519 Headache, unspecified: Secondary | ICD-10-CM | POA: Insufficient documentation

## 2022-10-14 NOTE — ED Triage Notes (Signed)
Pt states he has rt sided neck pain that goes to the back of his head. States it started Thursday morning where he "felt like he had a kink in his neck".

## 2022-10-15 MED ORDER — MELOXICAM 15 MG PO TABS: 15.0000 mg | ORAL_TABLET | Freq: Every day | ORAL | 0 refills | Status: AC | PRN

## 2022-10-15 MED ORDER — DIAZEPAM 5 MG PO TABS
5.0000 mg | ORAL_TABLET | Freq: Once | ORAL | Status: AC
  Administered 2022-10-15: 5 mg via ORAL
  Filled 2022-10-15: qty 1

## 2022-10-15 MED ORDER — CYCLOBENZAPRINE HCL 10 MG PO TABS: 10.0000 mg | ORAL_TABLET | Freq: Two times a day (BID) | ORAL | 0 refills | Status: AC | PRN

## 2022-10-15 MED ORDER — LIDOCAINE 5 % EX PTCH
1.0000 | MEDICATED_PATCH | CUTANEOUS | Status: DC
  Administered 2022-10-15: 1 via TRANSDERMAL
  Filled 2022-10-15: qty 1

## 2022-10-15 MED ORDER — KETOROLAC TROMETHAMINE 60 MG/2ML IM SOLN
30.0000 mg | Freq: Once | INTRAMUSCULAR | Status: AC
  Administered 2022-10-15: 30 mg via INTRAMUSCULAR
  Filled 2022-10-15: qty 2

## 2022-10-15 MED ORDER — 1ST MEDX-PATCH/ LIDOCAINE 4-0.025-5-20 % EX PTCH: 1.0000 | MEDICATED_PATCH | Freq: Every day | CUTANEOUS | 0 refills | Status: AC | PRN

## 2022-10-15 NOTE — ED Provider Notes (Signed)
[place] EMERGENCY DEPARTMENT AT [facility] Provider Note   CSN: [id number] Arrival date & time: 10/14/22  1946     History  Chief Complaint  Patient presents with   Headache   Neck Pain    Shane Mayo is a 42 y.o. male.   Headache Associated symptoms: neck pain   Neck Pain Pain location:  L side and R side Quality:  Stabbing and cramping Pain radiates to:  Head Pain severity:  Moderate Duration:  2 days Timing:  Constant Chronicity:  New Context: not fall   Relieved by:  None tried Worsened by:  Nothing Ineffective treatments:  None tried Associated symptoms: headaches        Home Medications Prior to Admission medications   Medication Sig Start Date End Date Taking? Authorizing Provider  cyclobenzaprine (FLEXERIL) 10 MG tablet Take 1 tablet (10 mg total) by mouth 2 (two) times daily as needed for muscle spasms. 10/15/22  Yes Debroh Sieloff, Barbara Cower, MD  Lido-Capsaicin-Men-Methyl Sal (1ST MEDX-PATCH/ LIDOCAINE) 4-0.025-5-20 % PTCH Apply 1 patch topically daily as needed for up to 10 days (pain). 10/15/22 10/25/22 Yes Kala Gassmann, Barbara Cower, MD  meloxicam (MOBIC) 15 MG tablet Take 1 tablet (15 mg total) by mouth daily as needed for up to 10 days for pain. 10/15/22 10/25/22 Yes Rayli Wiederhold, Barbara Cower, MD  glipiZIDE (GLUCOTROL XL) 5 MG 24 hr tablet Take 5 mg by mouth every morning. 09/21/21   [provider]  metFORMIN (GLUCOPHAGE) 500 MG tablet Take 500 mg by mouth 2 (two) times daily. 09/21/21   [provider]  ondansetron (ZOFRAN) 4 MG tablet Take 1 tablet (4 mg total) by mouth every 6 (six) hours. 02/12/22   Dartha Lodge, PA-C  pantoprazole (PROTONIX) 40 MG tablet Take 1 tablet (40 mg total) by mouth 2 (two) times daily before a meal. 02/12/22   Dartha Lodge, PA-C  pioglitazone (ACTOS) 15 MG tablet Take 15 mg by mouth daily. 10/15/21   [provider]  sucralfate (CARAFATE) 1 GM/10ML suspension Take 10 mLs (1 g total) by mouth 4 (four) times daily -  with meals  and at bedtime. 02/12/22   Dartha Lodge, PA-C      Allergies    Patient has no known allergies.    Review of Systems   Review of Systems  Musculoskeletal:  Positive for neck pain.  Neurological:  Positive for headaches.    Physical Exam Updated Vital Signs BP (!) 130/98 (BP Location: Right Arm)   Pulse (!) 112   Temp 98.3 F (36.8 C) (Oral)   Resp 17   Ht 6\' 3"  (1.905 m)   Wt 83.9 kg   SpO2 99%   BMI 23.12 kg/m  Physical Exam Vitals and nursing note reviewed.  Constitutional:      Appearance: He is well-developed.  HENT:     Head: Normocephalic and atraumatic.  Cardiovascular:     Rate and Rhythm: Normal rate.  Pulmonary:     Effort: Pulmonary effort is normal. No respiratory distress.  Abdominal:     General: There is no distension.  Musculoskeletal:        General: Tenderness (bilateral paracervical) present. Normal range of motion.     Cervical back: Normal range of motion.  Neurological:     Mental Status: He is alert.     ED Results / Procedures / Treatments   Labs (all labs ordered are listed, but only abnormal results are displayed) Labs Reviewed - No data to display  EKG None  Radiology No results found.  Procedures Procedures    Medications Ordered in ED Medications  diazepam (VALIUM) tablet 5 mg (5 mg Oral Given 10/15/22 0016)  ketorolac (TORADOL) injection 30 mg (30 mg Intramuscular Given 10/15/22 0016)    ED Course/ Medical Decision Making/ A&P                             Medical Decision Making Risk OTC drugs. Prescription drug management.   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 No neurologic changes to suggest arterial dissection. No fever, recent illness, headache, AMS to suggest meningitis/SAH or need fro imaging.   Valium given. Toradol given. Lidocaine patch palced. Will d/c to continue same and follow up with PCP if not improving.     Final Clinical Impression(s) / ED Diagnoses Final diagnoses:  Neck pain    Rx / DC Orders ED  Discharge Orders          Ordered    cyclobenzaprine (FLEXERIL) 10 MG tablet  2 times daily PRN        10/15/22 0005    Lido-Capsaicin-Men-Methyl Sal (1ST MEDX-PATCH/ LIDOCAINE) 4-0.025-5-20 % PTCH  Daily PRN        10/15/22 0005    meloxicam (MOBIC) 15 MG tablet  Daily PRN        10/15/22 0005              Tenzin Edelman, Barbara Cower, MD 10/15/22 [phone]

## 2023-11-01 IMAGING — US US ABDOMEN LIMITED
1 series · 14 of 25 positions shown · non-contrast
Comparison: None.

CLINICAL DATA: Right upper quadrant pain.

EXAM:
ULTRASOUND ABDOMEN LIMITED RIGHT UPPER QUADRANT

[Series 1: us abdomen limited ruq (liver/gb) · 14 of 77 slices shown]
[im 1/77]
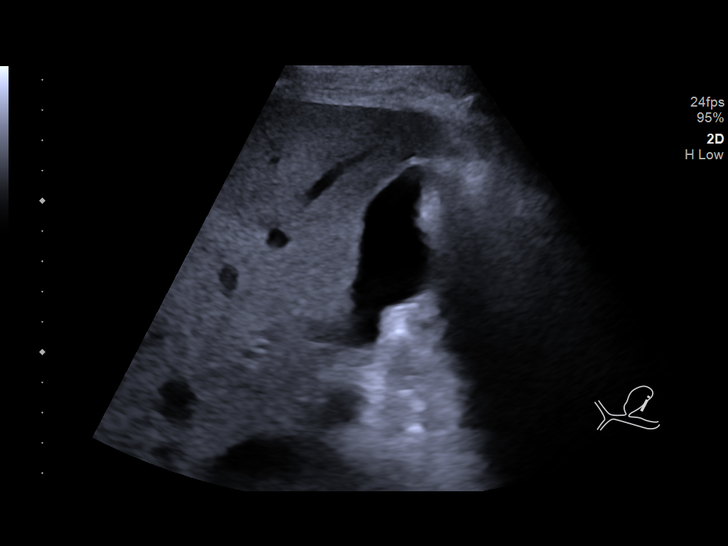
[im 7/77]
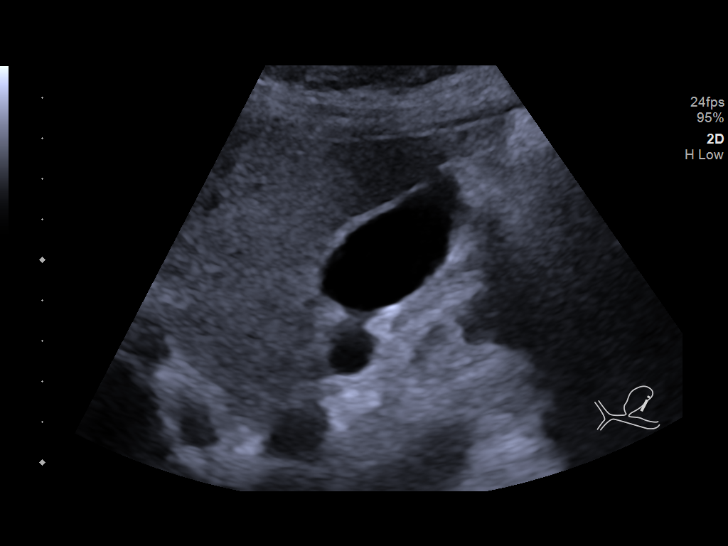
[im 13/77]
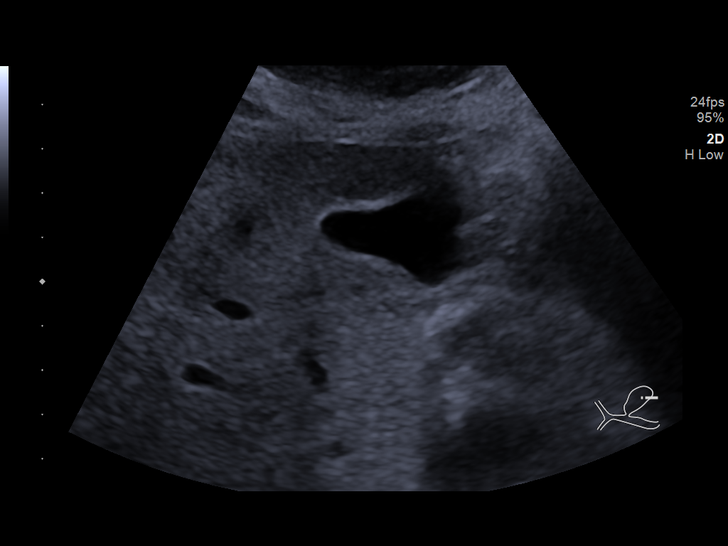
[im 20/77]
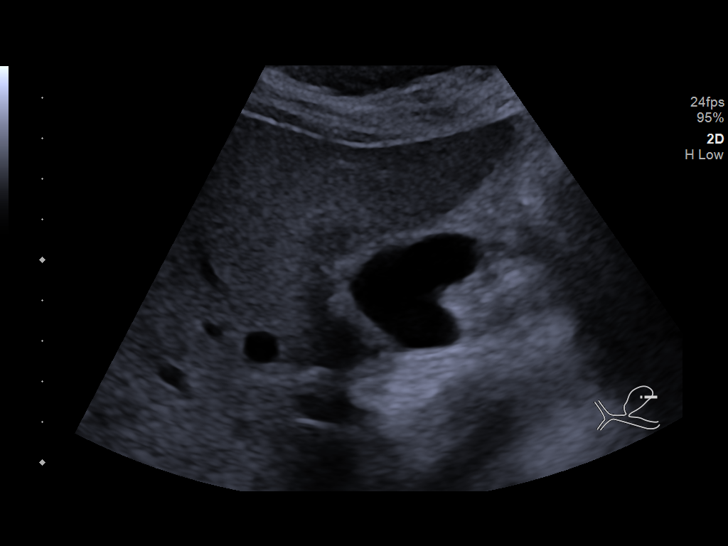
[im 26/77]
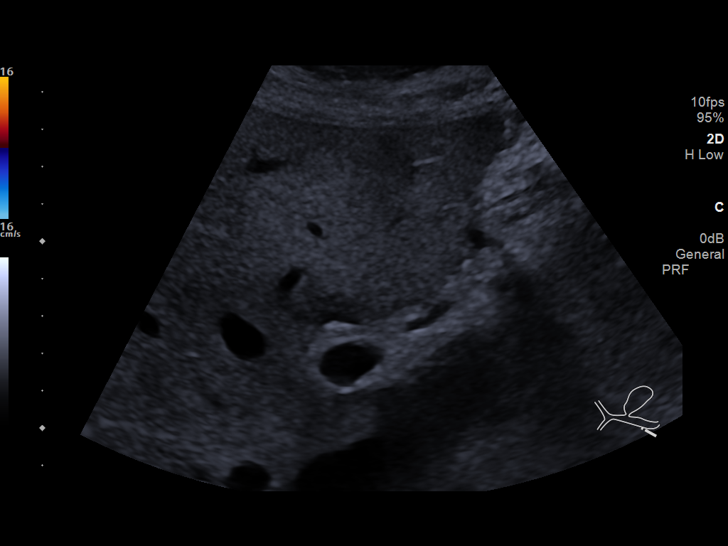
[im 29/77]
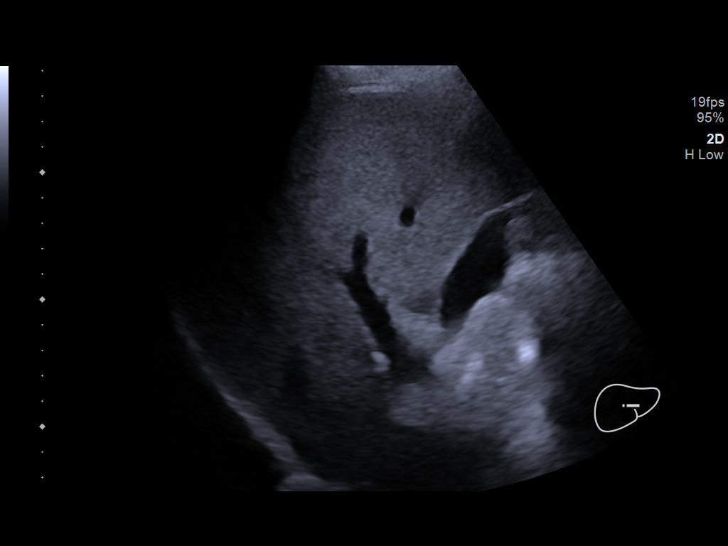
[im 35/77]
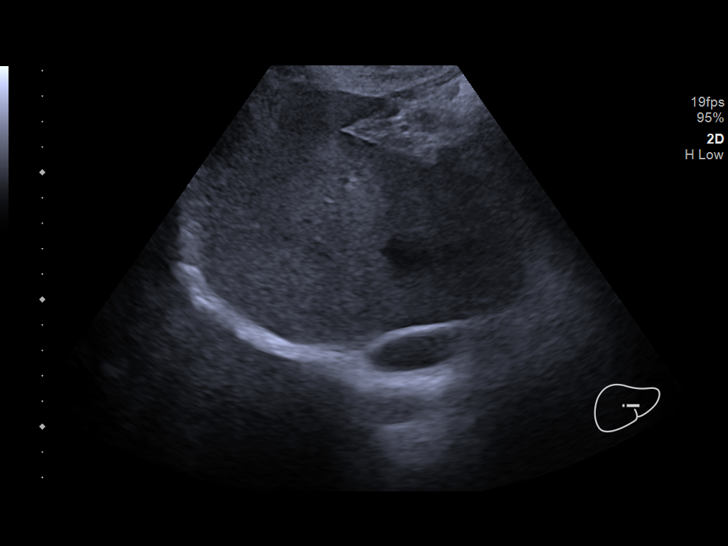
[im 42/77]
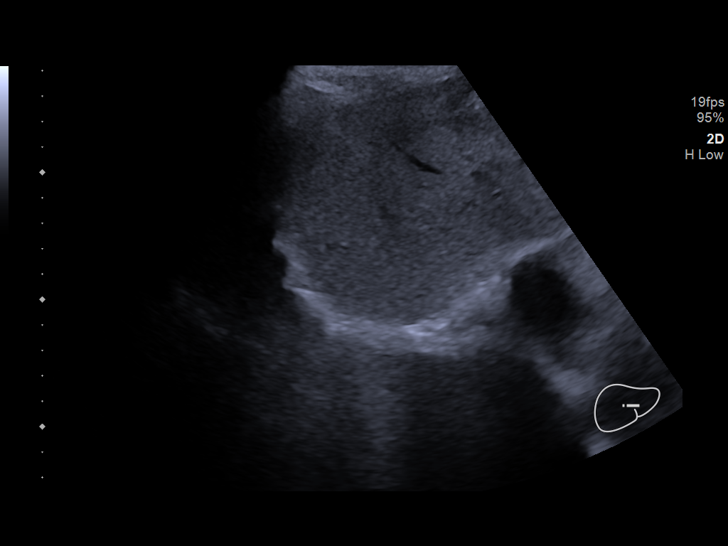
[im 48/77]
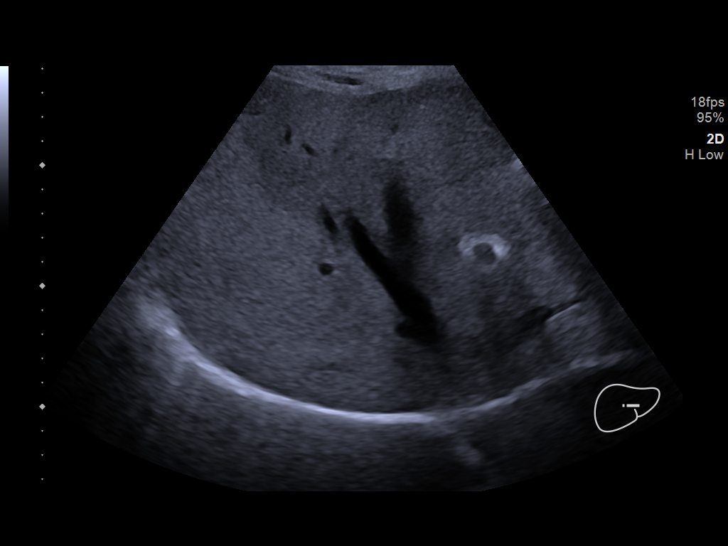
[im 51/77]
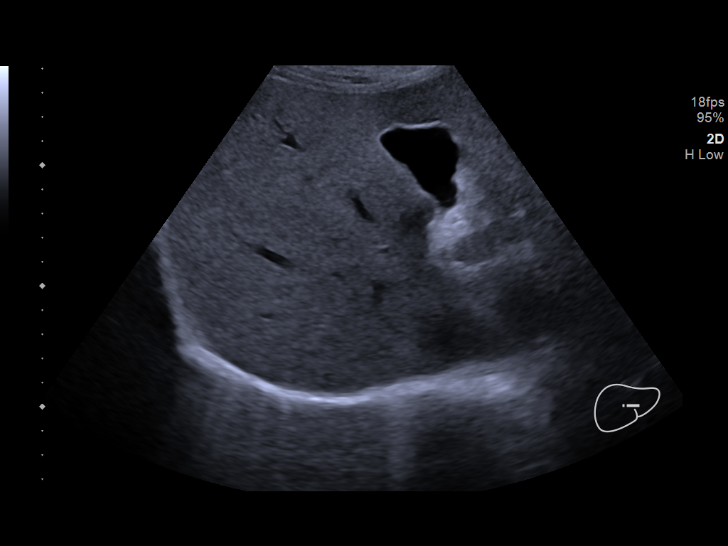
[im 58/77]
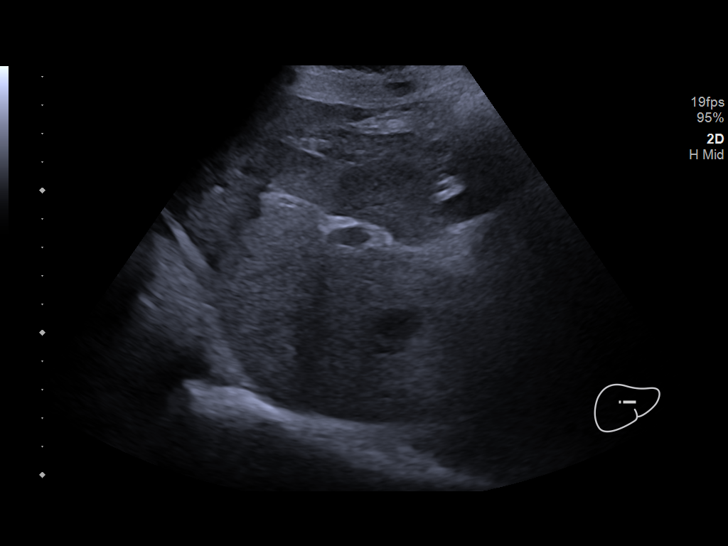
[im 64/77]
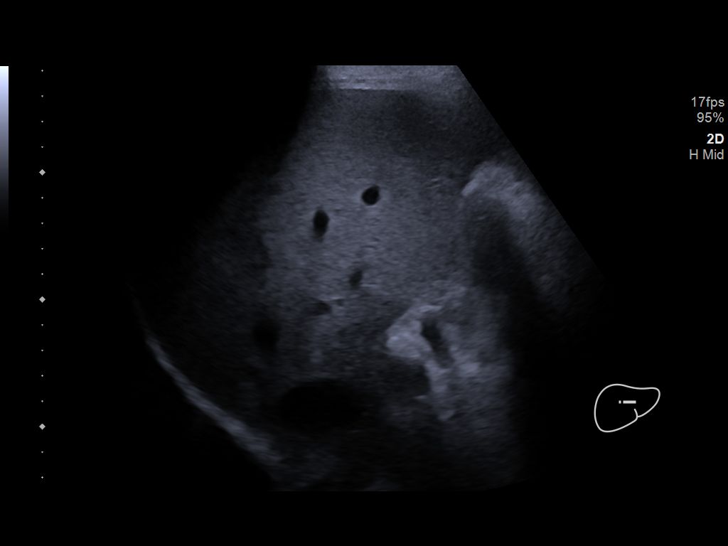
[im 70/77]
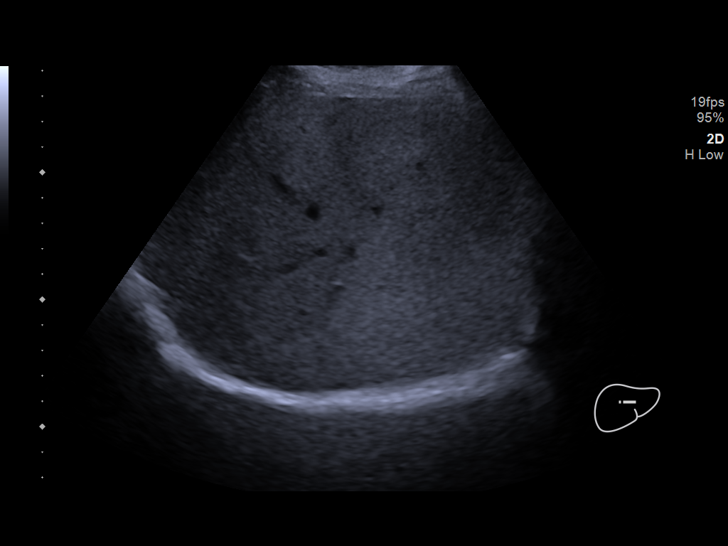
[im 77/77]
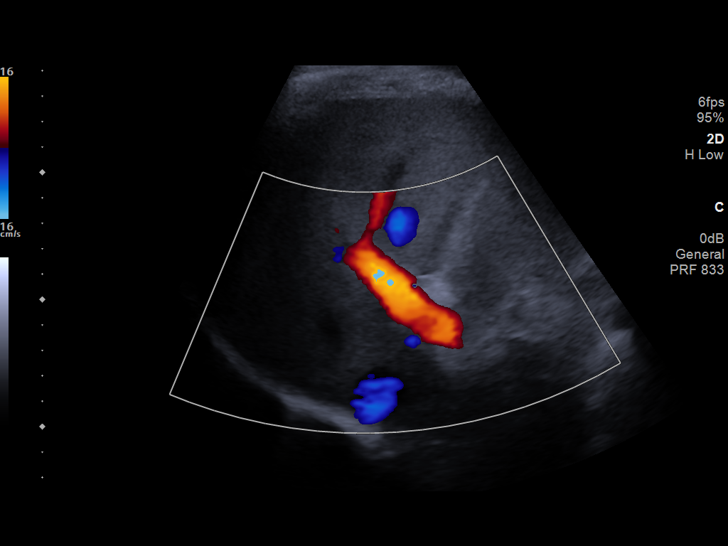

[14 of 25 positions shown; findings below may reference images not displayed]

FINDINGS: Gallbladder:

No gallstones or wall thickening visualized. No sonographic Murphy
sign noted by sonographer.

Common bile duct:

Diameter: 3 mm

Liver:

Increased echogenicity. No focal lesion. Portal vein is patent on
color Doppler imaging with normal direction of blood flow towards
the liver.

Other: None.
IMPRESSION: No cholelithiasis or sonographic evidence for acute cholecystitis.

Increased hepatic parenchymal echogenicity suggestive of steatosis.

## 2024-06-24 ENCOUNTER — Encounter (INDEPENDENT_AMBULATORY_CARE_PROVIDER_SITE_OTHER): Payer: Self-pay | Admitting: *Deleted
